# Patient Record
Sex: Male | Born: 1970 | Race: White | Hispanic: No | Marital: Married | Smoking: Never smoker
Health system: Southern US, Community
[De-identification: ages and names within clinical notes are randomized; demographics above are authoritative.]

## PROBLEM LIST (undated history)

## (undated) DIAGNOSIS — F419 Anxiety disorder, unspecified: Secondary | ICD-10-CM

## (undated) DIAGNOSIS — G4730 Sleep apnea, unspecified: Secondary | ICD-10-CM

## (undated) DIAGNOSIS — I10 Essential (primary) hypertension: Secondary | ICD-10-CM

## (undated) DIAGNOSIS — H00019 Hordeolum externum unspecified eye, unspecified eyelid: Secondary | ICD-10-CM

## (undated) HISTORY — PX: KNEE SURGERY: SHX244

## (undated) HISTORY — PX: NO PAST SURGERIES: SHX2092

---

## 2008-10-20 ENCOUNTER — Emergency Department: Payer: Self-pay | Admitting: Emergency Medicine

## 2009-05-18 ENCOUNTER — Emergency Department: Payer: Self-pay | Admitting: Emergency Medicine

## 2014-09-25 ENCOUNTER — Encounter: Payer: Self-pay | Admitting: Family Medicine

## 2014-09-25 ENCOUNTER — Ambulatory Visit (INDEPENDENT_AMBULATORY_CARE_PROVIDER_SITE_OTHER): Payer: BLUE CROSS/BLUE SHIELD | Admitting: Family Medicine

## 2014-09-25 VITALS — BP 122/78 | HR 77 | Resp 16 | Ht 71.0 in | Wt 194.4 lb

## 2014-09-25 DIAGNOSIS — B349 Viral infection, unspecified: Secondary | ICD-10-CM | POA: Diagnosis not present

## 2014-09-25 DIAGNOSIS — Z833 Family history of diabetes mellitus: Secondary | ICD-10-CM

## 2014-09-25 DIAGNOSIS — E663 Overweight: Secondary | ICD-10-CM | POA: Insufficient documentation

## 2014-09-25 DIAGNOSIS — Z283 Underimmunization status: Secondary | ICD-10-CM

## 2014-09-25 DIAGNOSIS — B351 Tinea unguium: Secondary | ICD-10-CM

## 2014-09-25 DIAGNOSIS — Z800 Family history of malignant neoplasm of digestive organs: Secondary | ICD-10-CM | POA: Diagnosis not present

## 2014-09-25 DIAGNOSIS — J029 Acute pharyngitis, unspecified: Secondary | ICD-10-CM | POA: Diagnosis not present

## 2014-09-25 DIAGNOSIS — Z289 Immunization not carried out for unspecified reason: Secondary | ICD-10-CM

## 2014-09-25 LAB — POCT RAPID STREP A (OFFICE): Rapid Strep A Screen: NEGATIVE

## 2014-09-25 NOTE — Progress Notes (Signed)
Date:  09/25/2014   Name:  Steven Barrett   DOB:  October 22, 1970   MRN:  [id number]  PCP:  Schuyler AmorWilliam Johnanna Bakke, MD    Chief Complaint: Cough and Fatigue   History of Present Illness:  This is a 44 y.o. male with ST for 2 days associated with NP cough, ear pressure, mild rhinorrhea, hoarse this morning, myalgias but no fever/chills. On Lamisil for 3 months for onychomycosis, working well. Tetanus unknown, no recent blood work.  Review of Systems:  Review of Systems  Patient Active Problem List   Diagnosis Date Noted  . Overweight (BMI 25.0-29.9) 09/25/2014  . FH: diabetes mellitus 09/25/2014  . FH: colon cancer 09/25/2014    Prior to Admission medications   Medication Sig Start Date End Date Taking? Authorizing Provider  terbinafine (LAMISIL) 250 MG tablet Take 1 tablet by mouth daily. 07/08/14 10/06/14 Yes Historical Provider, MD    No Known Allergies  Past Surgical History  Procedure Laterality Date  . No past surgeries      History  Substance Use Topics  . Smoking status: Never Smoker   . Smokeless tobacco: Not on file  . Alcohol Use: 0.0 oz/week    0 Standard drinks or equivalent per week     Comment: occasionally    Family History  Problem Relation Age of Onset  . Colon cancer Mother   . Diabetes Father   . Pancreatitis Father     Medication list has been reviewed and updated.  Physical Examination: BP 122/78 mmHg  Pulse 77  Resp 16  Ht 5\' 11"  (1.803 m)  Wt 194 lb 6.4 oz (88.179 kg)  BMI 27.13 kg/m2  SpO2 97%  Physical Exam  Constitutional: He is oriented to person, place, and time. He appears well-developed and well-nourished. No distress.  HENT:  Head: Normocephalic and atraumatic.  Right Ear: External ear normal.  Left Ear: External ear normal.  OP erythema no exudate  Eyes: Conjunctivae and EOM are normal. Pupils are equal, round, and reactive to light. No scleral icterus.  Neck: Neck supple. No thyromegaly present.  Cardiovascular: Normal rate,  regular rhythm and normal heart sounds.   Pulmonary/Chest: Effort normal and breath sounds normal.  Abdominal: Soft. He exhibits no distension and no mass. There is no tenderness.  Musculoskeletal: He exhibits no edema.  Lymphadenopathy:    He has no cervical adenopathy.  Neurological: He is alert and oriented to person, place, and time. Coordination normal.  Skin: Skin is warm and dry. No rash noted.  Psychiatric: He has a normal mood and affect. His behavior is normal.    Assessment and Plan:  1. Overweight (BMI 25.0-29.9) Discussed exercise/weight loss - COMPLETE METABOLIC PANEL WITH GFR - CBC - Lipid Profile  2. FH: diabetes mellitus - HgB A1c  3. Pharyngitis with viral syndrome RST negative, likely viral - POCT rapid strep A  4. Onychomycosis Complete course Lamisil  5. FH: colon cancer Advised colonoscopy age 44  6. Delayed immunizations Needs Tdap, not available today   Return in about 4 weeks (around 10/23/2014).  Steven AnoWilliam M. Kingsley Barrett, Jr. MD [facility]  09/25/2014

## 2014-09-26 LAB — LIPID PANEL
CHOL/HDL RATIO: 4 ratio (ref 0.0–5.0)
Cholesterol, Total: 132 mg/dL (ref 100–199)
HDL: 33 mg/dL — ABNORMAL LOW (ref >39)
LDL Calculated: 62 mg/dL (ref 0–99)
TRIGLYCERIDES: 183 mg/dL — AB (ref 0–149)
VLDL Cholesterol Cal: 37 mg/dL (ref 5–40)

## 2014-09-26 LAB — CBC
HEMATOCRIT: 45.9 % (ref 37.5–51.0)
Hemoglobin: 16 g/dL (ref 12.6–17.7)
MCH: 31.4 pg (ref 26.6–33.0)
MCHC: 34.9 g/dL (ref 31.5–35.7)
MCV: 90 fL (ref 79–97)
Platelets: 214 10*3/uL (ref 150–379)
RBC: 5.09 x10E6/uL (ref 4.14–5.80)
RDW: 13.7 % (ref 12.3–15.4)
WBC: 8.5 10*3/uL (ref 3.4–10.8)

## 2014-09-26 LAB — HEMOGLOBIN A1C
ESTIMATED AVERAGE GLUCOSE: 114 mg/dL
Hgb A1c MFr Bld: 5.6 % (ref 4.8–5.6)

## 2014-09-26 LAB — CREATINE: Creatine, Serum: 0.2 mg/dL (ref 0.0–0.7)

## 2014-10-02 ENCOUNTER — Encounter: Payer: Self-pay | Admitting: Family Medicine

## 2016-03-08 DIAGNOSIS — M530 Cervicocranial syndrome: Secondary | ICD-10-CM | POA: Diagnosis not present

## 2016-03-08 DIAGNOSIS — M9901 Segmental and somatic dysfunction of cervical region: Secondary | ICD-10-CM | POA: Diagnosis not present

## 2016-03-08 DIAGNOSIS — M531 Cervicobrachial syndrome: Secondary | ICD-10-CM | POA: Diagnosis not present

## 2016-03-08 DIAGNOSIS — M9902 Segmental and somatic dysfunction of thoracic region: Secondary | ICD-10-CM | POA: Diagnosis not present

## 2016-04-06 DIAGNOSIS — M531 Cervicobrachial syndrome: Secondary | ICD-10-CM | POA: Diagnosis not present

## 2016-04-06 DIAGNOSIS — M9901 Segmental and somatic dysfunction of cervical region: Secondary | ICD-10-CM | POA: Diagnosis not present

## 2016-04-06 DIAGNOSIS — M9902 Segmental and somatic dysfunction of thoracic region: Secondary | ICD-10-CM | POA: Diagnosis not present

## 2016-04-06 DIAGNOSIS — M530 Cervicocranial syndrome: Secondary | ICD-10-CM | POA: Diagnosis not present

## 2016-04-27 DIAGNOSIS — M530 Cervicocranial syndrome: Secondary | ICD-10-CM | POA: Diagnosis not present

## 2016-04-27 DIAGNOSIS — M9902 Segmental and somatic dysfunction of thoracic region: Secondary | ICD-10-CM | POA: Diagnosis not present

## 2016-04-27 DIAGNOSIS — M9901 Segmental and somatic dysfunction of cervical region: Secondary | ICD-10-CM | POA: Diagnosis not present

## 2016-04-27 DIAGNOSIS — M531 Cervicobrachial syndrome: Secondary | ICD-10-CM | POA: Diagnosis not present

## 2016-05-17 DIAGNOSIS — Z3009 Encounter for other general counseling and advice on contraception: Secondary | ICD-10-CM | POA: Diagnosis not present

## 2016-05-19 DIAGNOSIS — M530 Cervicocranial syndrome: Secondary | ICD-10-CM | POA: Diagnosis not present

## 2016-05-19 DIAGNOSIS — M9902 Segmental and somatic dysfunction of thoracic region: Secondary | ICD-10-CM | POA: Diagnosis not present

## 2016-05-19 DIAGNOSIS — M9901 Segmental and somatic dysfunction of cervical region: Secondary | ICD-10-CM | POA: Diagnosis not present

## 2016-05-19 DIAGNOSIS — M531 Cervicobrachial syndrome: Secondary | ICD-10-CM | POA: Diagnosis not present

## 2016-06-09 DIAGNOSIS — M9901 Segmental and somatic dysfunction of cervical region: Secondary | ICD-10-CM | POA: Diagnosis not present

## 2016-06-09 DIAGNOSIS — M530 Cervicocranial syndrome: Secondary | ICD-10-CM | POA: Diagnosis not present

## 2016-06-09 DIAGNOSIS — M9902 Segmental and somatic dysfunction of thoracic region: Secondary | ICD-10-CM | POA: Diagnosis not present

## 2016-06-09 DIAGNOSIS — M531 Cervicobrachial syndrome: Secondary | ICD-10-CM | POA: Diagnosis not present

## 2016-06-16 DIAGNOSIS — M9901 Segmental and somatic dysfunction of cervical region: Secondary | ICD-10-CM | POA: Diagnosis not present

## 2016-06-16 DIAGNOSIS — M530 Cervicocranial syndrome: Secondary | ICD-10-CM | POA: Diagnosis not present

## 2016-06-16 DIAGNOSIS — M9902 Segmental and somatic dysfunction of thoracic region: Secondary | ICD-10-CM | POA: Diagnosis not present

## 2016-06-16 DIAGNOSIS — M531 Cervicobrachial syndrome: Secondary | ICD-10-CM | POA: Diagnosis not present

## 2016-08-11 DIAGNOSIS — M530 Cervicocranial syndrome: Secondary | ICD-10-CM | POA: Diagnosis not present

## 2016-08-11 DIAGNOSIS — M9902 Segmental and somatic dysfunction of thoracic region: Secondary | ICD-10-CM | POA: Diagnosis not present

## 2016-08-11 DIAGNOSIS — M9901 Segmental and somatic dysfunction of cervical region: Secondary | ICD-10-CM | POA: Diagnosis not present

## 2016-08-11 DIAGNOSIS — M531 Cervicobrachial syndrome: Secondary | ICD-10-CM | POA: Diagnosis not present

## 2016-09-07 DIAGNOSIS — M530 Cervicocranial syndrome: Secondary | ICD-10-CM | POA: Diagnosis not present

## 2016-09-07 DIAGNOSIS — M9901 Segmental and somatic dysfunction of cervical region: Secondary | ICD-10-CM | POA: Diagnosis not present

## 2016-09-07 DIAGNOSIS — M9902 Segmental and somatic dysfunction of thoracic region: Secondary | ICD-10-CM | POA: Diagnosis not present

## 2016-09-07 DIAGNOSIS — M531 Cervicobrachial syndrome: Secondary | ICD-10-CM | POA: Diagnosis not present

## 2016-09-27 DIAGNOSIS — M9902 Segmental and somatic dysfunction of thoracic region: Secondary | ICD-10-CM | POA: Diagnosis not present

## 2016-09-27 DIAGNOSIS — M530 Cervicocranial syndrome: Secondary | ICD-10-CM | POA: Diagnosis not present

## 2016-09-27 DIAGNOSIS — M531 Cervicobrachial syndrome: Secondary | ICD-10-CM | POA: Diagnosis not present

## 2016-09-27 DIAGNOSIS — M9901 Segmental and somatic dysfunction of cervical region: Secondary | ICD-10-CM | POA: Diagnosis not present

## 2016-10-18 DIAGNOSIS — M9902 Segmental and somatic dysfunction of thoracic region: Secondary | ICD-10-CM | POA: Diagnosis not present

## 2016-10-18 DIAGNOSIS — M530 Cervicocranial syndrome: Secondary | ICD-10-CM | POA: Diagnosis not present

## 2016-10-18 DIAGNOSIS — M531 Cervicobrachial syndrome: Secondary | ICD-10-CM | POA: Diagnosis not present

## 2016-10-18 DIAGNOSIS — M9901 Segmental and somatic dysfunction of cervical region: Secondary | ICD-10-CM | POA: Diagnosis not present

## 2016-11-08 DIAGNOSIS — M9902 Segmental and somatic dysfunction of thoracic region: Secondary | ICD-10-CM | POA: Diagnosis not present

## 2016-11-08 DIAGNOSIS — M9901 Segmental and somatic dysfunction of cervical region: Secondary | ICD-10-CM | POA: Diagnosis not present

## 2016-11-08 DIAGNOSIS — M530 Cervicocranial syndrome: Secondary | ICD-10-CM | POA: Diagnosis not present

## 2016-11-08 DIAGNOSIS — M531 Cervicobrachial syndrome: Secondary | ICD-10-CM | POA: Diagnosis not present

## 2016-12-06 DIAGNOSIS — M531 Cervicobrachial syndrome: Secondary | ICD-10-CM | POA: Diagnosis not present

## 2016-12-06 DIAGNOSIS — M9902 Segmental and somatic dysfunction of thoracic region: Secondary | ICD-10-CM | POA: Diagnosis not present

## 2016-12-06 DIAGNOSIS — M9901 Segmental and somatic dysfunction of cervical region: Secondary | ICD-10-CM | POA: Diagnosis not present

## 2016-12-06 DIAGNOSIS — M530 Cervicocranial syndrome: Secondary | ICD-10-CM | POA: Diagnosis not present

## 2016-12-27 DIAGNOSIS — M9901 Segmental and somatic dysfunction of cervical region: Secondary | ICD-10-CM | POA: Diagnosis not present

## 2016-12-27 DIAGNOSIS — M530 Cervicocranial syndrome: Secondary | ICD-10-CM | POA: Diagnosis not present

## 2016-12-27 DIAGNOSIS — M531 Cervicobrachial syndrome: Secondary | ICD-10-CM | POA: Diagnosis not present

## 2016-12-27 DIAGNOSIS — M9902 Segmental and somatic dysfunction of thoracic region: Secondary | ICD-10-CM | POA: Diagnosis not present

## 2017-01-18 DIAGNOSIS — M531 Cervicobrachial syndrome: Secondary | ICD-10-CM | POA: Diagnosis not present

## 2017-01-18 DIAGNOSIS — M530 Cervicocranial syndrome: Secondary | ICD-10-CM | POA: Diagnosis not present

## 2017-01-18 DIAGNOSIS — M9901 Segmental and somatic dysfunction of cervical region: Secondary | ICD-10-CM | POA: Diagnosis not present

## 2017-01-18 DIAGNOSIS — M9902 Segmental and somatic dysfunction of thoracic region: Secondary | ICD-10-CM | POA: Diagnosis not present

## 2017-02-07 DIAGNOSIS — M9901 Segmental and somatic dysfunction of cervical region: Secondary | ICD-10-CM | POA: Diagnosis not present

## 2017-02-07 DIAGNOSIS — M9902 Segmental and somatic dysfunction of thoracic region: Secondary | ICD-10-CM | POA: Diagnosis not present

## 2017-02-07 DIAGNOSIS — M531 Cervicobrachial syndrome: Secondary | ICD-10-CM | POA: Diagnosis not present

## 2017-02-07 DIAGNOSIS — M530 Cervicocranial syndrome: Secondary | ICD-10-CM | POA: Diagnosis not present

## 2017-03-08 DIAGNOSIS — M9901 Segmental and somatic dysfunction of cervical region: Secondary | ICD-10-CM | POA: Diagnosis not present

## 2017-03-08 DIAGNOSIS — M530 Cervicocranial syndrome: Secondary | ICD-10-CM | POA: Diagnosis not present

## 2017-03-08 DIAGNOSIS — M531 Cervicobrachial syndrome: Secondary | ICD-10-CM | POA: Diagnosis not present

## 2017-03-08 DIAGNOSIS — M9902 Segmental and somatic dysfunction of thoracic region: Secondary | ICD-10-CM | POA: Diagnosis not present

## 2017-04-04 DIAGNOSIS — M531 Cervicobrachial syndrome: Secondary | ICD-10-CM | POA: Diagnosis not present

## 2017-04-04 DIAGNOSIS — M9901 Segmental and somatic dysfunction of cervical region: Secondary | ICD-10-CM | POA: Diagnosis not present

## 2017-04-04 DIAGNOSIS — M530 Cervicocranial syndrome: Secondary | ICD-10-CM | POA: Diagnosis not present

## 2017-04-04 DIAGNOSIS — M9902 Segmental and somatic dysfunction of thoracic region: Secondary | ICD-10-CM | POA: Diagnosis not present

## 2017-04-30 DIAGNOSIS — J00 Acute nasopharyngitis [common cold]: Secondary | ICD-10-CM | POA: Diagnosis not present

## 2017-04-30 DIAGNOSIS — R51 Headache: Secondary | ICD-10-CM | POA: Diagnosis not present

## 2017-04-30 DIAGNOSIS — M79605 Pain in left leg: Secondary | ICD-10-CM | POA: Diagnosis not present

## 2017-04-30 DIAGNOSIS — R197 Diarrhea, unspecified: Secondary | ICD-10-CM | POA: Diagnosis not present

## 2017-04-30 DIAGNOSIS — M79604 Pain in right leg: Secondary | ICD-10-CM | POA: Diagnosis not present

## 2017-04-30 DIAGNOSIS — R82998 Other abnormal findings in urine: Secondary | ICD-10-CM | POA: Diagnosis not present

## 2017-04-30 DIAGNOSIS — R112 Nausea with vomiting, unspecified: Secondary | ICD-10-CM | POA: Diagnosis not present

## 2017-05-02 DIAGNOSIS — M531 Cervicobrachial syndrome: Secondary | ICD-10-CM | POA: Diagnosis not present

## 2017-05-02 DIAGNOSIS — M9902 Segmental and somatic dysfunction of thoracic region: Secondary | ICD-10-CM | POA: Diagnosis not present

## 2017-05-02 DIAGNOSIS — M9901 Segmental and somatic dysfunction of cervical region: Secondary | ICD-10-CM | POA: Diagnosis not present

## 2017-05-02 DIAGNOSIS — M530 Cervicocranial syndrome: Secondary | ICD-10-CM | POA: Diagnosis not present

## 2017-05-30 DIAGNOSIS — M531 Cervicobrachial syndrome: Secondary | ICD-10-CM | POA: Diagnosis not present

## 2017-05-30 DIAGNOSIS — M9901 Segmental and somatic dysfunction of cervical region: Secondary | ICD-10-CM | POA: Diagnosis not present

## 2017-05-30 DIAGNOSIS — M9902 Segmental and somatic dysfunction of thoracic region: Secondary | ICD-10-CM | POA: Diagnosis not present

## 2017-05-30 DIAGNOSIS — M530 Cervicocranial syndrome: Secondary | ICD-10-CM | POA: Diagnosis not present

## 2017-06-08 DIAGNOSIS — M531 Cervicobrachial syndrome: Secondary | ICD-10-CM | POA: Diagnosis not present

## 2017-06-08 DIAGNOSIS — M9902 Segmental and somatic dysfunction of thoracic region: Secondary | ICD-10-CM | POA: Diagnosis not present

## 2017-06-08 DIAGNOSIS — M9901 Segmental and somatic dysfunction of cervical region: Secondary | ICD-10-CM | POA: Diagnosis not present

## 2017-06-08 DIAGNOSIS — M530 Cervicocranial syndrome: Secondary | ICD-10-CM | POA: Diagnosis not present

## 2017-06-20 DIAGNOSIS — M9901 Segmental and somatic dysfunction of cervical region: Secondary | ICD-10-CM | POA: Diagnosis not present

## 2017-06-20 DIAGNOSIS — M9902 Segmental and somatic dysfunction of thoracic region: Secondary | ICD-10-CM | POA: Diagnosis not present

## 2017-06-20 DIAGNOSIS — M531 Cervicobrachial syndrome: Secondary | ICD-10-CM | POA: Diagnosis not present

## 2017-06-20 DIAGNOSIS — M530 Cervicocranial syndrome: Secondary | ICD-10-CM | POA: Diagnosis not present

## 2017-07-26 DIAGNOSIS — M9902 Segmental and somatic dysfunction of thoracic region: Secondary | ICD-10-CM | POA: Diagnosis not present

## 2017-07-26 DIAGNOSIS — M531 Cervicobrachial syndrome: Secondary | ICD-10-CM | POA: Diagnosis not present

## 2017-07-26 DIAGNOSIS — M530 Cervicocranial syndrome: Secondary | ICD-10-CM | POA: Diagnosis not present

## 2017-07-26 DIAGNOSIS — M9901 Segmental and somatic dysfunction of cervical region: Secondary | ICD-10-CM | POA: Diagnosis not present

## 2017-08-24 DIAGNOSIS — M9901 Segmental and somatic dysfunction of cervical region: Secondary | ICD-10-CM | POA: Diagnosis not present

## 2017-08-24 DIAGNOSIS — M9902 Segmental and somatic dysfunction of thoracic region: Secondary | ICD-10-CM | POA: Diagnosis not present

## 2017-08-24 DIAGNOSIS — M531 Cervicobrachial syndrome: Secondary | ICD-10-CM | POA: Diagnosis not present

## 2017-08-24 DIAGNOSIS — M530 Cervicocranial syndrome: Secondary | ICD-10-CM | POA: Diagnosis not present

## 2017-10-18 DIAGNOSIS — M531 Cervicobrachial syndrome: Secondary | ICD-10-CM | POA: Diagnosis not present

## 2017-10-18 DIAGNOSIS — M9901 Segmental and somatic dysfunction of cervical region: Secondary | ICD-10-CM | POA: Diagnosis not present

## 2017-10-18 DIAGNOSIS — M530 Cervicocranial syndrome: Secondary | ICD-10-CM | POA: Diagnosis not present

## 2017-10-18 DIAGNOSIS — M9902 Segmental and somatic dysfunction of thoracic region: Secondary | ICD-10-CM | POA: Diagnosis not present

## 2017-11-09 DIAGNOSIS — M9901 Segmental and somatic dysfunction of cervical region: Secondary | ICD-10-CM | POA: Diagnosis not present

## 2017-11-09 DIAGNOSIS — M9902 Segmental and somatic dysfunction of thoracic region: Secondary | ICD-10-CM | POA: Diagnosis not present

## 2017-11-09 DIAGNOSIS — M531 Cervicobrachial syndrome: Secondary | ICD-10-CM | POA: Diagnosis not present

## 2017-11-09 DIAGNOSIS — M530 Cervicocranial syndrome: Secondary | ICD-10-CM | POA: Diagnosis not present

## 2017-11-15 DIAGNOSIS — H00024 Hordeolum internum left upper eyelid: Secondary | ICD-10-CM | POA: Diagnosis not present

## 2018-01-02 DIAGNOSIS — M531 Cervicobrachial syndrome: Secondary | ICD-10-CM | POA: Diagnosis not present

## 2018-01-02 DIAGNOSIS — M530 Cervicocranial syndrome: Secondary | ICD-10-CM | POA: Diagnosis not present

## 2018-01-02 DIAGNOSIS — M9902 Segmental and somatic dysfunction of thoracic region: Secondary | ICD-10-CM | POA: Diagnosis not present

## 2018-01-02 DIAGNOSIS — M9901 Segmental and somatic dysfunction of cervical region: Secondary | ICD-10-CM | POA: Diagnosis not present

## 2018-01-09 DIAGNOSIS — M530 Cervicocranial syndrome: Secondary | ICD-10-CM | POA: Diagnosis not present

## 2018-01-09 DIAGNOSIS — M9902 Segmental and somatic dysfunction of thoracic region: Secondary | ICD-10-CM | POA: Diagnosis not present

## 2018-01-09 DIAGNOSIS — M531 Cervicobrachial syndrome: Secondary | ICD-10-CM | POA: Diagnosis not present

## 2018-01-09 DIAGNOSIS — M9901 Segmental and somatic dysfunction of cervical region: Secondary | ICD-10-CM | POA: Diagnosis not present

## 2018-01-10 DIAGNOSIS — H00014 Hordeolum externum left upper eyelid: Secondary | ICD-10-CM | POA: Diagnosis not present

## 2018-01-13 ENCOUNTER — Other Ambulatory Visit: Payer: Self-pay

## 2018-01-13 ENCOUNTER — Encounter: Payer: Self-pay | Admitting: Emergency Medicine

## 2018-01-13 ENCOUNTER — Ambulatory Visit
Admission: EM | Admit: 2018-01-13 | Discharge: 2018-01-13 | Disposition: A | Discharge status: Home / Self Care | Payer: BLUE CROSS/BLUE SHIELD | Attending: Family Medicine | Admitting: Family Medicine

## 2018-01-13 DIAGNOSIS — L0100 Impetigo, unspecified: Secondary | ICD-10-CM | POA: Diagnosis not present

## 2018-01-13 MED ORDER — MUPIROCIN 2 % EX OINT
1.0000 | TOPICAL_OINTMENT | Freq: Three times a day (TID) | CUTANEOUS | 0 refills | Status: DC
Start: 2018-01-13 — End: 2018-01-19

## 2018-01-13 MED ORDER — DOXYCYCLINE HYCLATE 100 MG PO CAPS: 100.0000 mg | ORAL_CAPSULE | Freq: Two times a day (BID) | ORAL | 0 refills | Status: DC

## 2018-01-13 NOTE — ED Triage Notes (Signed)
Patient c/o red burning rash on his chest and an itchy rash on his right shoulder for the past 2 weeks.

## 2018-01-13 NOTE — ED Provider Notes (Signed)
[facility]    CSN: [id number] Arrival date & time: 01/13/18  1515     History   Chief Complaint Chief Complaint  Patient presents with  . Rash    HPI Nhan Qualley is a 47 y.o. male.   HPI  47 year old male presents with a red burning itchy  rash on his chest, under his chin, his neck and right shoulder.  He states he has satellite lesions in the groin as well as on his buttock. His son was diagnosed with impetigo about 2 weeks ago.  They had been wrestling playfully recently.  His other child also had impetigo prior to his son.         History reviewed. No pertinent past medical history.  Patient Active Problem List   Diagnosis Date Noted  . Overweight (BMI 25.0-29.9) 09/25/2014  . FH: diabetes mellitus 09/25/2014  . FH: colon cancer 09/25/2014    Past Surgical History:  Procedure Laterality Date  . KNEE SURGERY Right   . NO PAST SURGERIES         Home Medications    Prior to Admission medications   Medication Sig Start Date End Date Taking? Authorizing Provider  doxycycline (VIBRAMYCIN) 100 MG capsule Take 1 capsule (100 mg total) by mouth 2 (two) times daily. 01/13/18   Lutricia Feil, [name]  mupirocin ointment (BACTROBAN) 2 % Apply 1 application topically 3 (three) times daily. 01/13/18   Lutricia Feil, [name]    Family History Family History  Problem Relation Age of Onset  . Colon cancer Mother   . Diabetes Father   . Pancreatitis Father     Social History Social History   Tobacco Use  . Smoking status: Never Smoker  . Smokeless tobacco: Current User    Types: Chew  Substance Use Topics  . Alcohol use: Yes    Alcohol/week: 0.0 standard drinks    Comment: occasionally  . Drug use: No     Allergies   Patient has no known allergies.   Review of Systems Review of Systems  Constitutional: Negative for activity change, appetite change, chills, fatigue and fever.  Skin: Positive for rash.  All other systems  reviewed and are negative.        Physical Exam Triage Vital Signs ED Triage Vitals  Enc Vitals Group     BP 01/13/18 1528 (!) 141/91     Pulse Rate 01/13/18 1528 63     Resp 01/13/18 1528 16     Temp 01/13/18 1528 98 F (36.7 C)     Temp Source 01/13/18 1528 Oral     SpO2 01/13/18 1528 98 %     Weight 01/13/18 1524 205 lb (93 kg)     Height 01/13/18 1524 5\' 11"  (1.803 m)     Head Circumference --      Peak Flow --      Pain Score 01/13/18 1522 4     Pain Loc --      Pain Edu? --      Excl. in GC? --    No data found.  Updated Vital Signs BP (!) 141/91 (BP Location: Right Arm)   Pulse 63   Temp 98 F (36.7 C) (Oral)   Resp 16   Ht 5\' 11"  (1.803 m)   Wt 205 lb (93 kg)   SpO2 98%   BMI 28.59 kg/m   Visual Acuity Right Eye Distance:   Left Eye Distance:   Bilateral Distance:  Right Eye Near:   Left Eye Near:    Bilateral Near:     Physical Exam  Constitutional: He is oriented to person, place, and time. He appears well-developed and well-nourished. No distress.  HENT:  Head: Normocephalic.  Eyes: Pupils are equal, round, and reactive to light.  Neck: Normal range of motion.  Musculoskeletal: Normal range of motion.  Neurological: He is alert and oriented to person, place, and time.  Skin: Skin is warm and dry. Rash noted. He is not diaphoretic.  Had a several erythematous base is with a scaling and honey crusting over top.  Some excoriations present.  Refer to attached photographs for detail.  Psychiatric: He has a normal mood and affect. His behavior is normal. Thought content normal.  Nursing note and vitals reviewed.    UC Treatments / Results  Labs (all labs ordered are listed, but only abnormal results are displayed) Labs Reviewed - No data to display  EKG None  Radiology No results found.  Procedures Procedures (including critical care time)  Medications Ordered in UC Medications - No data to display  Initial Impression /  Assessment and Plan / UC Course  I have reviewed the triage vital signs and the nursing notes.  Pertinent labs & imaging results that were available during my care of the patient were reviewed by me and considered in my medical decision making (see chart for details).   I will treat the patient with both topical mupirocin ointment to apply 3 times daily and also start him on doxycycline 1 twice daily for 7 days because of the extent of the other lesions.  Provided him with information regarding impetigo and how in infectious this is.  He is not improving he should follow-up with dermatology.  . Final Clinical Impressions(s) / UC Diagnoses   Final diagnoses:  Impetigo   Discharge Instructions   None    ED Prescriptions    Medication Sig Dispense Auth. Provider   mupirocin ointment (BACTROBAN) 2 % Apply 1 application topically 3 (three) times daily. 30 g Ovid Curd P, [name]   doxycycline (VIBRAMYCIN) 100 MG capsule Take 1 capsule (100 mg total) by mouth 2 (two) times daily. 14 capsule Lutricia Feil, [name]     Controlled Substance Prescriptions [place] Controlled Substance Registry consulted? Not Applicable   Lutricia Feil, [name] 01/13/18 1606

## 2018-01-15 DIAGNOSIS — R42 Dizziness and giddiness: Secondary | ICD-10-CM | POA: Diagnosis not present

## 2018-01-15 DIAGNOSIS — L0100 Impetigo, unspecified: Secondary | ICD-10-CM | POA: Diagnosis not present

## 2018-01-15 DIAGNOSIS — R0602 Shortness of breath: Secondary | ICD-10-CM | POA: Diagnosis not present

## 2018-01-15 DIAGNOSIS — R0789 Other chest pain: Secondary | ICD-10-CM | POA: Diagnosis not present

## 2018-01-15 DIAGNOSIS — Z6828 Body mass index (BMI) 28.0-28.9, adult: Secondary | ICD-10-CM | POA: Diagnosis not present

## 2018-01-15 DIAGNOSIS — R110 Nausea: Secondary | ICD-10-CM | POA: Diagnosis not present

## 2018-01-15 DIAGNOSIS — I10 Essential (primary) hypertension: Secondary | ICD-10-CM | POA: Diagnosis not present

## 2018-01-15 DIAGNOSIS — R079 Chest pain, unspecified: Secondary | ICD-10-CM | POA: Diagnosis not present

## 2018-01-16 DIAGNOSIS — R42 Dizziness and giddiness: Secondary | ICD-10-CM | POA: Diagnosis not present

## 2018-01-16 DIAGNOSIS — R079 Chest pain, unspecified: Secondary | ICD-10-CM | POA: Diagnosis not present

## 2018-01-19 ENCOUNTER — Telehealth: Payer: Self-pay

## 2018-01-19 MED ORDER — MUPIROCIN 2 % EX OINT: 1.0000 "application " | TOPICAL_OINTMENT | Freq: Three times a day (TID) | CUTANEOUS | 1 refills | Status: DC

## 2018-01-20 ENCOUNTER — Ambulatory Visit (INDEPENDENT_AMBULATORY_CARE_PROVIDER_SITE_OTHER): Payer: BLUE CROSS/BLUE SHIELD | Admitting: Physician Assistant

## 2018-01-20 ENCOUNTER — Encounter: Payer: Self-pay | Admitting: Physician Assistant

## 2018-01-20 VITALS — BP 160/100 | HR 69 | Temp 98.1°F | Resp 16 | Wt 205.6 lb

## 2018-01-20 DIAGNOSIS — Z800 Family history of malignant neoplasm of digestive organs: Secondary | ICD-10-CM | POA: Diagnosis not present

## 2018-01-20 DIAGNOSIS — I10 Essential (primary) hypertension: Secondary | ICD-10-CM | POA: Diagnosis not present

## 2018-01-20 DIAGNOSIS — R739 Hyperglycemia, unspecified: Secondary | ICD-10-CM

## 2018-01-20 MED ORDER — AMLODIPINE BESYLATE 5 MG PO TABS: 5.0000 mg | ORAL_TABLET | Freq: Every day | ORAL | 0 refills | Status: DC

## 2018-01-20 NOTE — Progress Notes (Signed)
Patient: Steven Barrett, Male    DOB: November 07, 1970, 47 y.o.   MRN: [id number] Visit Date: 01/25/2018  Today's Provider: Trey Sailors, [name]   Chief Complaint  Patient presents with  . Establish Care  . New Patient (Initial Visit)   Subjective:    Establish Care Steven Barrett is a 47 y.o. male who presents today to establish care and for high blood pressure. Patient was also seen at [facility] on 10/27 for lightheadedness and chest pain and on 10/25 for Impetigo.Living in [place]. Wife and child age 27. Driver for [employer].  Patient reports he has checked his blood pressure at [facility] and it was 154/98.  Mother is age 61 when she was diagnosed with colon cancer   He reports having some episodes of lightheadedness. He also reports having an "off" feeling while driving, like he has to blink multiple times to get back to normal.   BP Readings from Last 3 Encounters:  01/24/18 (!) 132/98  01/20/18 (!) 160/100  01/13/18 (!) 141/91   Wt Readings from Last 3 Encounters:  01/24/18 208 lb (94.3 kg)  01/20/18 205 lb 9.6 oz (93.3 kg)  01/13/18 205 lb (93 kg)   -----------------------------------------------------------------   Review of Systems  Constitutional: Positive for fatigue.  Respiratory: Positive for chest tightness.   Musculoskeletal: Positive for back pain.  Skin: Positive for rash.  Neurological: Positive for dizziness.  Psychiatric/Behavioral: The patient is nervous/anxious.   All other systems reviewed and are negative.   Social History      He  reports that he has never smoked. His smokeless tobacco use includes chew. He reports that he drinks alcohol. He reports that he does not use drugs.       Social History   Socioeconomic History  . Marital status: Married    Spouse name: Not on file  . Number of children: Not on file  . Years of education: 12th grade  . Highest education level: Not on file  Occupational History  . Not on file  Social  Needs  . Financial resource strain: Not on file  . Food insecurity:    Worry: Not on file    Inability: Not on file  . Transportation needs:    Medical: Not on file    Non-medical: Not on file  Tobacco Use  . Smoking status: Never Smoker  . Smokeless tobacco: Current User    Types: Chew  Substance and Sexual Activity  . Alcohol use: Yes    Alcohol/week: 0.0 standard drinks    Comment: occasionally  . Drug use: No  . Sexual activity: Not on file  Lifestyle  . Physical activity:    Days per week: Not on file    Minutes per session: Not on file  . Stress: Not on file  Relationships  . Social connections:    Talks on phone: Not on file    Gets together: Not on file    Attends religious service: Not on file    Active member of club or organization: Not on file    Attends meetings of clubs or organizations: Not on file    Relationship status: Not on file  Other Topics Concern  . Not on file  Social History Narrative  . Not on file    History reviewed. No pertinent past medical history.   Patient Active Problem List   Diagnosis Date Noted  . Hypertension 01/20/2018  . Overweight (BMI 25.0-29.9) 09/25/2014  .  FH: diabetes mellitus 09/25/2014  . FH: colon cancer 09/25/2014    Past Surgical History:  Procedure Laterality Date  . KNEE SURGERY Right   . NO PAST SURGERIES      Family History        Family Status  Relation Name Status  . Mother  Alive  . Father  Alive        His family history includes Colon cancer in his mother; Diabetes in his father; Pancreatitis in his father.      No Known Allergies   Current Outpatient Medications:  .  amLODipine (NORVASC) 10 MG tablet, Take 1 tablet (10 mg total) by mouth daily., Disp: 90 tablet, Rfl: 3 .  mupirocin ointment (BACTROBAN) 2 %, Apply 1 application topically 3 (three) times daily., Disp: 30 g, Rfl: 1   Patient Care Team: Maryella Shivers as PCP - General (Physician Assistant)      Objective:    Vitals: BP (!) 160/100 (BP Location: Right Arm, Patient Position: Sitting, Cuff Size: Large)   Pulse 69   Temp 98.1 F (36.7 C) (Oral)   Resp 16   Wt 205 lb 9.6 oz (93.3 kg)   SpO2 97%   BMI 28.68 kg/m    Vitals:   01/20/18 1027  BP: (!) 160/100  Pulse: 69  Resp: 16  Temp: 98.1 F (36.7 C)  TempSrc: Oral  SpO2: 97%  Weight: 205 lb 9.6 oz (93.3 kg)     Physical Exam  Constitutional: He is oriented to person, place, and time.  Cardiovascular: Normal rate and regular rhythm.  Pulmonary/Chest: Effort normal and breath sounds normal.  Neurological: He is alert and oriented to person, place, and time.  Skin: Skin is warm and dry.  Psychiatric: He has a normal mood and affect. His behavior is normal.     Depression Screen PHQ 2/9 Scores 01/20/2018  PHQ - 2 Score 1      Assessment & Plan:     Routine Health Maintenance and Physical Exam  Exercise Activities and Dietary recommendations Goals   None      There is no immunization history on file for this patient.  Health Maintenance  Topic Date Due  . HIV Screening  07/10/1986  . TETANUS/TDAP  07/10/1990  . INFLUENZA VACCINE  10/20/2017     Discussed health benefits of physical activity, and encouraged him to engage in regular exercise appropriate for his age and condition.    1. Hypertension, unspecified type  Start amlodipine 5 mg daily. I have reviewed CBC and CMET from [facility] ER visit and they are normal. Work note provided.  - Lipid Profile - TSH  2. Hyperglycemia  - HgB A1c  3. FH: colon cancer  Will need colonoscopy.  Return in about 1 week (around 01/27/2018).  The entirety of the information documented in the History of Present Illness, Review of Systems and Physical Exam were personally obtained by me. Portions of this information were initially documented by Horton Marshall, CMA and reviewed by me for thoroughness and accuracy.     --------------------------------------------------------------------    Trey Sailors, [name]  [facility]

## 2018-01-21 LAB — HEMOGLOBIN A1C
Est. average glucose Bld gHb Est-mCnc: 105 mg/dL
Hgb A1c MFr Bld: 5.3 % (ref 4.8–5.6)

## 2018-01-21 LAB — LIPID PANEL
Chol/HDL Ratio: 4.2 ratio (ref 0.0–5.0)
Cholesterol, Total: 122 mg/dL (ref 100–199)
HDL: 29 mg/dL — ABNORMAL LOW (ref >39)
LDL Calculated: 46 mg/dL (ref 0–99)
Triglycerides: 236 mg/dL — ABNORMAL HIGH (ref 0–149)
VLDL Cholesterol Cal: 47 mg/dL — ABNORMAL HIGH (ref 5–40)

## 2018-01-21 LAB — TSH: TSH: 2.07 u[IU]/mL (ref 0.450–4.500)

## 2018-01-23 ENCOUNTER — Ambulatory Visit: Payer: BLUE CROSS/BLUE SHIELD | Admitting: Family Medicine

## 2018-01-23 DIAGNOSIS — R079 Chest pain, unspecified: Secondary | ICD-10-CM | POA: Diagnosis not present

## 2018-01-24 ENCOUNTER — Ambulatory Visit: Payer: BLUE CROSS/BLUE SHIELD | Admitting: Physician Assistant

## 2018-01-24 VITALS — BP 132/98 | HR 76 | Temp 97.9°F | Resp 16 | Wt 208.0 lb

## 2018-01-24 DIAGNOSIS — R42 Dizziness and giddiness: Secondary | ICD-10-CM | POA: Diagnosis not present

## 2018-01-24 DIAGNOSIS — R29818 Other symptoms and signs involving the nervous system: Secondary | ICD-10-CM

## 2018-01-24 DIAGNOSIS — I10 Essential (primary) hypertension: Secondary | ICD-10-CM | POA: Diagnosis not present

## 2018-01-24 MED ORDER — AMLODIPINE BESYLATE 10 MG PO TABS: 10.0000 mg | ORAL_TABLET | Freq: Every day | ORAL | 3 refills | Status: DC

## 2018-01-24 MED ORDER — AMLODIPINE BESYLATE 10 MG PO TABS: 10.0000 mg | ORAL_TABLET | Freq: Every day | ORAL | 0 refills | Status: DC

## 2018-01-24 NOTE — Patient Instructions (Signed)

## 2018-01-24 NOTE — Progress Notes (Signed)
Patient: Steven Barrett Male    DOB: 14-May-1970   47 y.o.   MRN: [id number] Visit Date: 01/25/2018  Today's Provider: Trey Sailors, [name]   Chief Complaint  Patient presents with  . Hypertension   Subjective:    HPI  Follow up for blood pressure  The patient was last seen for this 4 days ago. Changes made at last visit include start amlodipine 5 mg. Has not taken amlodipine today yet.   He reports excellent compliance with treatment. He feels that condition is Unchanged. He is not having side effects.   He reports he is still having sensation of dizziness. He describes this as happening inconsistently when he is on the road. He does not have dizziness when turning over in bed. He does not have dizziness associated with nausea, vomiting, or imbalance. He does not described dizziness as the room spinning or he himself feeling off balance.   BP Readings from Last 3 Encounters:  01/24/18 (!) 132/98  01/20/18 (!) 160/100  01/13/18 (!) 141/91   His wife who is present today in clinic reports a longstanding history of significant snoring and witnessed apnea. She has to wake him up at points for him to reposition. He has never had sleep study done.   ------------------------------------------------------------------------------------       No Known Allergies   Current Outpatient Medications:  .  amLODipine (NORVASC) 10 MG tablet, Take 1 tablet (10 mg total) by mouth daily., Disp: 90 tablet, Rfl: 3 .  mupirocin ointment (BACTROBAN) 2 %, Apply 1 application topically 3 (three) times daily., Disp: 30 g, Rfl: 1  Review of Systems  Constitutional: Negative.   Respiratory: Positive for shortness of breath.   Cardiovascular: Negative.     Social History   Tobacco Use  . Smoking status: Never Smoker  . Smokeless tobacco: Current User    Types: Chew  Substance Use Topics  . Alcohol use: Yes    Alcohol/week: 0.0 standard drinks    Comment: occasionally    Objective:   BP (!) 132/98 (BP Location: Left Arm, Patient Position: Sitting, Cuff Size: Large)   Pulse 76   Temp 97.9 F (36.6 C) (Oral)   Resp 16   Wt 208 lb (94.3 kg)   SpO2 98%   BMI 29.01 kg/m  Vitals:   01/24/18 1223  BP: (!) 132/98  Pulse: 76  Resp: 16  Temp: 97.9 F (36.6 C)  TempSrc: Oral  SpO2: 98%  Weight: 208 lb (94.3 kg)    Physical Exam  Constitutional: He is oriented to person, place, and time. He appears well-developed and well-nourished.  Eyes: EOM are normal.  Cardiovascular: Normal rate and regular rhythm.  Pulmonary/Chest: Effort normal and breath sounds normal.  Neurological: He is alert and oriented to person, place, and time.  Skin: Skin is warm and dry.  Psychiatric: He has a normal mood and affect. His behavior is normal.   Results of the Epworth flowsheet 01/24/2018  Sitting and reading 2  Watching TV 3  Sitting, inactive in a public place (e.g. a theatre or a meeting) 1  As a passenger in a car for an hour without a break 2  Lying down to rest in the afternoon when circumstances permit 2  Sitting and talking to someone 0  Sitting quietly after a lunch without alcohol 0  In a car, while stopped for a few minutes in traffic 0  Total score 10  Assessment & Plan:     1. Hypertension, unspecified type  Increase amlodipine to 5 mg daily as he has had good response but BP still uncontrolled.   - amLODipine (NORVASC) 10 MG tablet; Take 1 tablet (10 mg total) by mouth daily.  Dispense: 90 tablet; Refill: 3  2. Dizziness  Would like to have him evaluated for sleep apnea, see below. Work note provided, will fill out FMLA as faxed.  3. Suspected sleep apnea  Epworth today is 10.   - Home sleep test  Return in about 1 week (around 01/31/2018) for HTN.  The entirety of the information documented in the History of Present Illness, Review of Systems and Physical Exam were personally obtained by me. Portions of this information were  initially documented by Rondel Baton, CMA and reviewed by me for thoroughness and accuracy.         Trey Sailors, [name]  [facility]

## 2018-01-25 NOTE — Patient Instructions (Signed)

## 2018-01-26 ENCOUNTER — Telehealth: Payer: Self-pay

## 2018-01-26 ENCOUNTER — Telehealth: Payer: Self-pay | Admitting: Physician Assistant

## 2018-01-26 DIAGNOSIS — R0683 Snoring: Secondary | ICD-10-CM

## 2018-01-26 DIAGNOSIS — R0681 Apnea, not elsewhere classified: Secondary | ICD-10-CM

## 2018-01-26 NOTE — Telephone Encounter (Signed)
Order for home sleep study faxed to [facility] °

## 2018-01-26 NOTE — Telephone Encounter (Signed)
Patient was advised via vm (per DPR). 

## 2018-01-26 NOTE — Telephone Encounter (Signed)
He is following up on sleep study referral

## 2018-01-26 NOTE — Telephone Encounter (Signed)
Orders signed for sleep study.

## 2018-01-26 NOTE — Telephone Encounter (Signed)
Theres no message

## 2018-01-31 ENCOUNTER — Encounter: Payer: Self-pay | Admitting: Physician Assistant

## 2018-01-31 ENCOUNTER — Ambulatory Visit: Payer: BLUE CROSS/BLUE SHIELD | Admitting: Physician Assistant

## 2018-01-31 VITALS — BP 138/92 | HR 74 | Temp 98.0°F | Resp 16 | Wt 212.0 lb

## 2018-01-31 DIAGNOSIS — R29818 Other symptoms and signs involving the nervous system: Secondary | ICD-10-CM

## 2018-01-31 DIAGNOSIS — I10 Essential (primary) hypertension: Secondary | ICD-10-CM

## 2018-01-31 MED ORDER — LISINOPRIL 10 MG PO TABS: 10.0000 mg | ORAL_TABLET | Freq: Every day | ORAL | 0 refills | Status: DC

## 2018-01-31 NOTE — Progress Notes (Signed)
       Patient: Steven CosierRonald Mccumbers Male    DOB: [birth year]   47 y.o.   MRN: [id number] Visit Date: 01/31/2018  Today's Provider: Trey SailorsAdriana M Pollak, [name]   Chief Complaint  Patient presents with  . Hypertension   Subjective:    HPI  Follow up for hypertension  The patient was last seen for this 1 weeks ago. Changes made at last visit include increased amlodipine to 10 mg daily.  He reports excellent compliance with treatment. He feels that condition is Unchanged. He is not having side effects.   BP Readings from Last 3 Encounters:  01/31/18 (!) 138/92  01/24/18 (!) 132/98  01/20/18 (!) 160/100   Suspected Sleep Apnea: Home sleep study administrators have been out to house to set up study and will be completing in several days.  ------------------------------------------------------------------------------------       No Known Allergies   Current Outpatient Medications:  .  amLODipine (NORVASC) 10 MG tablet, Take 1 tablet (10 mg total) by mouth daily., Disp: 90 tablet, Rfl: 3 .  mupirocin ointment (BACTROBAN) 2 %, Apply 1 application topically 3 (three) times daily., Disp: 30 g, Rfl: 1  Review of Systems  Constitutional: Negative.   Respiratory: Negative.   Cardiovascular: Negative.     Social History   Tobacco Use  . Smoking status: Never Smoker  . Smokeless tobacco: Current User    Types: Chew  Substance Use Topics  . Alcohol use: Yes    Alcohol/week: 0.0 standard drinks    Comment: occasionally   Objective:   BP (!) 138/92 (BP Location: Left Arm, Patient Position: Sitting, Cuff Size: Large)   Pulse 74   Temp 98 F (36.7 C) (Oral)   Resp 16   Wt 212 lb (96.2 kg)   BMI 29.57 kg/m  Vitals:   01/31/18 1216  BP: (!) 138/92  Pulse: 74  Resp: 16  Temp: 98 F (36.7 C)  TempSrc: Oral  Weight: 212 lb (96.2 kg)     Physical Exam  Constitutional: He is oriented to person, place, and time. He appears well-developed and well-nourished.    Cardiovascular: Normal rate and regular rhythm.  Pulmonary/Chest: Effort normal and breath sounds normal.  Neurological: He is alert and oriented to person, place, and time.  Skin: Skin is warm and dry.  Psychiatric: He has a normal mood and affect. His behavior is normal.        Assessment & Plan:     1. Hypertension, unspecified type  Continue amlodipine 10 mg daily. BP still uncontrolled and will add Lisinopril. See him back in one month or sooner if needed.   - lisinopril (PRINIVIL,ZESTRIL) 10 MG tablet; Take 1 tablet (10 mg total) by mouth daily.  Dispense: 90 tablet; Refill: 0  2. Suspected sleep apnea  Sleep study pending. Do wonder if this is contributing to his blood pressure and would like to see how his blood pressure responds to CPAP should he need it.   Return in about 1 month (around 03/02/2018) for HTN, sleep apnea .  The entirety of the information documented in the History of Present Illness, Review of Systems and Physical Exam were personally obtained by me. Portions of this information were initially documented by Rondel BatonSulibeya Dimas, CMA and reviewed by me for thoroughness and accuracy.          Trey SailorsAdriana M Pollak, [name]  [facility]

## 2018-02-01 DIAGNOSIS — G4733 Obstructive sleep apnea (adult) (pediatric): Secondary | ICD-10-CM | POA: Diagnosis not present

## 2018-02-01 DIAGNOSIS — R0602 Shortness of breath: Secondary | ICD-10-CM | POA: Diagnosis not present

## 2018-02-01 NOTE — Patient Instructions (Signed)

## 2018-02-02 DIAGNOSIS — G4733 Obstructive sleep apnea (adult) (pediatric): Secondary | ICD-10-CM | POA: Diagnosis not present

## 2018-02-02 DIAGNOSIS — R0602 Shortness of breath: Secondary | ICD-10-CM | POA: Diagnosis not present

## 2018-02-06 ENCOUNTER — Telehealth: Payer: Self-pay | Admitting: Physician Assistant

## 2018-02-06 NOTE — Telephone Encounter (Signed)
I haven't gotten any FMLA paperwork for him. Can they resend it?

## 2018-02-06 NOTE — Telephone Encounter (Signed)
Pt calling back regarding FMLA paperwork.  Needing to ask some questions.  Thanks, [employer]

## 2018-02-08 NOTE — Telephone Encounter (Signed)
Forms received today.

## 2018-02-10 ENCOUNTER — Telehealth: Payer: Self-pay

## 2018-02-10 DIAGNOSIS — I10 Essential (primary) hypertension: Secondary | ICD-10-CM | POA: Diagnosis not present

## 2018-02-10 DIAGNOSIS — R1084 Generalized abdominal pain: Secondary | ICD-10-CM | POA: Diagnosis not present

## 2018-02-10 DIAGNOSIS — R109 Unspecified abdominal pain: Secondary | ICD-10-CM | POA: Diagnosis not present

## 2018-02-10 DIAGNOSIS — R21 Rash and other nonspecific skin eruption: Secondary | ICD-10-CM | POA: Diagnosis not present

## 2018-02-10 DIAGNOSIS — K381 Appendicular concretions: Secondary | ICD-10-CM | POA: Diagnosis not present

## 2018-02-10 DIAGNOSIS — R6883 Chills (without fever): Secondary | ICD-10-CM | POA: Diagnosis not present

## 2018-02-10 DIAGNOSIS — R197 Diarrhea, unspecified: Secondary | ICD-10-CM | POA: Diagnosis not present

## 2018-02-10 DIAGNOSIS — R112 Nausea with vomiting, unspecified: Secondary | ICD-10-CM | POA: Diagnosis not present

## 2018-02-10 DIAGNOSIS — R1033 Periumbilical pain: Secondary | ICD-10-CM | POA: Diagnosis not present

## 2018-02-10 DIAGNOSIS — Z79899 Other long term (current) drug therapy: Secondary | ICD-10-CM | POA: Diagnosis not present

## 2018-02-10 NOTE — Telephone Encounter (Signed)
Called patient to follow up on telephone advice call from last night at 11:49 pm.  Patient reports he has been sick all night and his wife prefers he go to the ER. sd Adriana advised. sd

## 2018-02-13 ENCOUNTER — Telehealth: Payer: Self-pay | Admitting: Physician Assistant

## 2018-02-13 DIAGNOSIS — G4733 Obstructive sleep apnea (adult) (pediatric): Secondary | ICD-10-CM | POA: Diagnosis not present

## 2018-02-13 NOTE — Telephone Encounter (Signed)
Pt went to ER on Friday - Nov. 22 and had a lot of fluid in his colon.  Pt has had severe diahrea and pain in stomach.  ER is telling pt he is needing to have a colonoscopy and more tests done.  Asked pt to let his PCP know.   Please advise.  Thanks, [employer]

## 2018-02-15 NOTE — Telephone Encounter (Signed)
Noted, thanks!

## 2018-02-21 ENCOUNTER — Ambulatory Visit (INDEPENDENT_AMBULATORY_CARE_PROVIDER_SITE_OTHER): Payer: BLUE CROSS/BLUE SHIELD | Admitting: Physician Assistant

## 2018-02-21 ENCOUNTER — Encounter: Payer: Self-pay | Admitting: Physician Assistant

## 2018-02-21 VITALS — BP 130/80 | HR 62 | Temp 98.0°F | Resp 16 | Wt 204.6 lb

## 2018-02-21 DIAGNOSIS — Z800 Family history of malignant neoplasm of digestive organs: Secondary | ICD-10-CM

## 2018-02-21 DIAGNOSIS — I10 Essential (primary) hypertension: Secondary | ICD-10-CM

## 2018-02-21 DIAGNOSIS — R42 Dizziness and giddiness: Secondary | ICD-10-CM

## 2018-02-21 DIAGNOSIS — G4730 Sleep apnea, unspecified: Secondary | ICD-10-CM

## 2018-02-21 DIAGNOSIS — A0811 Acute gastroenteropathy due to Norwalk agent: Secondary | ICD-10-CM

## 2018-02-21 NOTE — Patient Instructions (Signed)

## 2018-02-21 NOTE — Progress Notes (Signed)
Patient: Steven CosierRonald Runquist Male    DOB: 11-13-1970   47 y.o.   MRN: [id number] Visit Date: 02/21/2018  Today's Provider: Trey SailorsAdriana M Jevante Hollibaugh, [name]   Chief Complaint  Patient presents with  . Follow-up    HTN and ED visit.   Subjective:    HPI Patient here today to discuss disability paper work and reports that he went to the ED on 02/10/18 and was treated for chills. Reports that he had chills and had a CT scan done and showed he has fluids in his colon. He was diagnosed with norovirus at this time. Patient would like a referral to GI to have a Colonoscopy done. Family hx of colon cancer in his mother, diagnosed at age 46.   Patient also reports that he blood pressures are still running high and and he has been feeling dizzy off and on. Reports that his blood pressure readings have been 145-155/80's. Patient diet is "unhealthy". He is not adherent to low salt diet. Denies chest pain, leg swelling, SOB. He had 10 mg lisinopril added to 10 mg amlodipine.   In the interim, he has had a sleep study completed and he was positive for obstructive sleep apnea. He has received his CPAP machine and patient reports that he is sleeping better with his cpap machine. He reports going through some adjustment phase with his mask being too small and then leaking but he feels he has sorted that out now.  He continues to be dizzy. Can be dizzy at rest, but had one episode of dizziness with head movement while driving. He reports becoming dizzy being a passenger in the car with his wife.   BP Readings from Last 3 Encounters:  02/21/18 130/80  01/31/18 (!) 138/92  01/24/18 (!) 132/98       No Known Allergies   Current Outpatient Medications:  .  amLODipine (NORVASC) 10 MG tablet, Take 1 tablet (10 mg total) by mouth daily., Disp: 90 tablet, Rfl: 3 .  lisinopril (PRINIVIL,ZESTRIL) 10 MG tablet, Take 1 tablet (10 mg total) by mouth daily., Disp: 90 tablet, Rfl: 0 .  mupirocin ointment (BACTROBAN) 2 %,  Apply 1 application topically 3 (three) times daily., Disp: 30 g, Rfl: 1  Review of Systems  Cardiovascular: Negative for chest pain, palpitations and leg swelling.  Neurological: Positive for dizziness.    Social History   Tobacco Use  . Smoking status: Never Smoker  . Smokeless tobacco: Current User    Types: Chew  Substance Use Topics  . Alcohol use: Yes    Alcohol/week: 0.0 standard drinks    Comment: occasionally   Objective:   BP 130/80 (BP Location: Right Arm, Patient Position: Sitting, Cuff Size: Large)   Pulse 62   Temp 98 F (36.7 C) (Oral)   Resp 16   Wt 204 lb 9.6 oz (92.8 kg)   SpO2 98%   BMI 28.54 kg/m  Vitals:   02/21/18 1537  BP: 130/80  Pulse: 62  Resp: 16  Temp: 98 F (36.7 C)  TempSrc: Oral  SpO2: 98%  Weight: 204 lb 9.6 oz (92.8 kg)     Physical Exam  Constitutional: He is oriented to person, place, and time. He appears well-developed and well-nourished.  Cardiovascular: Normal rate and regular rhythm.  Pulmonary/Chest: Effort normal and breath sounds normal.  Abdominal: Soft. Bowel sounds are normal.  Neurological: He is alert and oriented to person, place, and time.  Skin: Skin is warm and dry.  Psychiatric: He has a normal mood and affect. His behavior is normal.        Assessment & Plan:     1. Hypertension, unspecified type  Hypertension is controlled on amlodipine 10 mg daily and lisinopril 10 mg daily. Reviewed CMET from care everywhere encounter at [facility] and it is acceptable after initiation of Lisinopril.   2. Family history of colon cancer  Mother with colon cancer at age 8, will need earlier colonoscopy.   - Ambulatory referral to Gastroenterology  3. Norovirus  - Ambulatory referral to Gastroenterology  4. 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   - Ambulatory referral to ENT  5. Sleep apnea, unspecified type  Continue with CPAP, he is using this nightly and it gives him  relief from daytime fatigue.   Return in about 4 months (around 06/23/2018) for hypertension .  The entirety of the information documented in the History of Present Illness, Review of Systems and Physical Exam were personally obtained by me. Portions of this information were initially documented by Hetty Ely, CMA and reviewed by me for thoroughness and accuracy.             Trey Sailors, [name]  [facility]

## 2018-02-22 DIAGNOSIS — G4730 Sleep apnea, unspecified: Secondary | ICD-10-CM | POA: Insufficient documentation

## 2018-02-28 ENCOUNTER — Ambulatory Visit: Payer: BLUE CROSS/BLUE SHIELD | Admitting: Family Medicine

## 2018-03-02 ENCOUNTER — Ambulatory Visit: Payer: Self-pay | Admitting: Physician Assistant

## 2018-03-02 ENCOUNTER — Telehealth: Payer: Self-pay | Admitting: Physician Assistant

## 2018-03-02 NOTE — Telephone Encounter (Signed)
Noted, thanks!

## 2018-03-02 NOTE — Telephone Encounter (Signed)
Letting Ricki Rodriguezdriana know pt got an ENT appt on Dec 19th.  Thanks, [employer]

## 2018-03-06 ENCOUNTER — Telehealth: Payer: Self-pay | Admitting: Physician Assistant

## 2018-03-06 NOTE — Telephone Encounter (Signed)
Pt is calling back 3rd time to check on status.  Hasn't heard from anyone.  Thanks, [employer]

## 2018-03-06 NOTE — Telephone Encounter (Signed)
I have not received any extension of FMLA paperwork, I remember family telling me this would be sent but I have been watching fax and have not received it.

## 2018-03-06 NOTE — Telephone Encounter (Signed)
Patient's wife, Morrie Sheldonshley, needs to talk to someone regarding the FMLA paperwork for Ron.

## 2018-03-07 NOTE — Telephone Encounter (Signed)
Pt calling back regarding faxed paperwork.  Please call pt back asap.  Thanks, [employer]

## 2018-03-07 NOTE — Telephone Encounter (Signed)
We just received it. Once provider is done with patient and gets a chance to fill it out we will contact patient.

## 2018-03-07 NOTE — Telephone Encounter (Signed)
Spoke with wife and informed her we have not received any paper work and to ask the company to refax it.

## 2018-03-07 NOTE — Telephone Encounter (Signed)
Patient advised that records have been faxed.  Thanks,  -Theophile Harvie

## 2018-03-09 DIAGNOSIS — R42 Dizziness and giddiness: Secondary | ICD-10-CM | POA: Diagnosis not present

## 2018-03-09 DIAGNOSIS — H9319 Tinnitus, unspecified ear: Secondary | ICD-10-CM | POA: Diagnosis not present

## 2018-03-14 ENCOUNTER — Telehealth: Payer: Self-pay

## 2018-03-14 NOTE — Telephone Encounter (Signed)
Patient was advised that his FMLA paperwork was faxed and his provider extended him to be out of work until 03/29/2018 and will not do another extension per provider.

## 2018-03-14 NOTE — Telephone Encounter (Signed)
Patients wife had called office to check on status of FMLA form, I informed her that telephone from encounter 03/07/18 states that this form was faxed. Wife states that husband had spoke with CMA in office ( she did not remember her name) and told them that a new form would have to be sent over. According to wife CMA told her this would be filled out and done today. She is requesting call back. KW

## 2018-03-15 DIAGNOSIS — G4733 Obstructive sleep apnea (adult) (pediatric): Secondary | ICD-10-CM | POA: Diagnosis not present

## 2018-03-23 ENCOUNTER — Ambulatory Visit: Payer: BLUE CROSS/BLUE SHIELD | Admitting: Physician Assistant

## 2018-03-24 ENCOUNTER — Ambulatory Visit: Payer: BLUE CROSS/BLUE SHIELD | Admitting: Physician Assistant

## 2018-03-24 ENCOUNTER — Ambulatory Visit
Admission: RE | Admit: 2018-03-24 | Discharge: 2018-03-24 | Disposition: A | Discharge status: Home / Self Care | Payer: BLUE CROSS/BLUE SHIELD | Source: Ambulatory Visit | Attending: Physician Assistant | Admitting: Physician Assistant

## 2018-03-24 ENCOUNTER — Encounter: Payer: Self-pay | Admitting: Physician Assistant

## 2018-03-24 ENCOUNTER — Other Ambulatory Visit: Payer: Self-pay | Admitting: Physician Assistant

## 2018-03-24 VITALS — BP 136/86 | HR 62 | Temp 97.5°F | Wt 208.6 lb

## 2018-03-24 DIAGNOSIS — R42 Dizziness and giddiness: Secondary | ICD-10-CM

## 2018-03-24 DIAGNOSIS — K388 Other specified diseases of appendix: Secondary | ICD-10-CM | POA: Diagnosis not present

## 2018-03-24 DIAGNOSIS — R1033 Periumbilical pain: Secondary | ICD-10-CM

## 2018-03-24 DIAGNOSIS — K389 Disease of appendix, unspecified: Secondary | ICD-10-CM

## 2018-03-24 HISTORY — DX: Essential (primary) hypertension: I10

## 2018-03-24 LAB — POCT I-STAT CREATININE: Creatinine, Ser: 1 mg/dL (ref 0.61–1.24)

## 2018-03-24 MED ORDER — IOHEXOL 300 MG/ML  SOLN
100.0000 mL | Freq: Once | INTRAMUSCULAR | Status: AC | PRN
  Administered 2018-03-24: 100 mL via INTRAVENOUS

## 2018-03-24 NOTE — Progress Notes (Signed)
Patient: Steven Barrett Male    DOB: [birth year]   48 y.o.   MRN: [id number] Visit Date: 03/24/2018  Today's Provider: Trey Sailors, [name]   Chief Complaint  Patient presents with  . Dizziness   Subjective:    Patient presents today for dizziness and abdominal pain. Dizziness has been ongoing issue for several months - describes as "feeling off" at certain moments when looking up or at heights. He denies room spinning, nausea, vomiting. Orthostatic vitals have been normal. HTN and sleep apnea have been remedied and dizziness persists. ENT evaluation was negative for vertigo.   He also reports abdominal pain x 2 days. He has family history significant for colon cancer in his mother age 76's who has since died of the illness. On 03-03-2018 He was evaluated at [facility] ER for abdominal pain and vomiting. GI panel revealed norovirus. CT abdomen revealed the following:   - Multiple appendicoliths measuring up to 2.5 cm with mild appendiceal wall thickening and mild appendiceal dilation distally however without significant periappendiceal inflammatory changes. These findings could be secondary chronic inflammatory changes since there is no evidence periappendiceal inflammatory changes. Early acute appendicitis is not excluded based on imaging. Clinical correlation is recommended.   - Fluid-filled ascending colon and rectum, likely correlating with reported recent diarrhea.  He was not treated with any antibiotics, he was sent home after admin of IV fluids. Did appear to improve for a time. Now with periumbilical abdominal pain x 2 days and nausea without vomiting. Denies bloody stools, diarrhea is per his baseline. Denies fevers, chills. Has eaten at 8:30 this morning.   Dizziness  This is a recurrent problem. The problem occurs intermittently. The problem has been unchanged. Associated symptoms include abdominal pain.  GI Problem  The primary symptoms include abdominal pain and diarrhea. The  illness began more than 7 days ago. The problem has been gradually worsening.  The abdominal pain is generalized.  Associated symptoms comments: Weight gain .   Wt Readings from Last 3 Encounters:  03/24/18 208 lb 9.6 oz (94.6 kg)  02/21/18 204 lb 9.6 oz (92.8 kg)  01/31/18 212 lb (96.2 kg)    Had keflex in august and doxycycline in 01/13/2018   No Known Allergies   Current Outpatient Medications:  .  amLODipine (NORVASC) 10 MG tablet, Take 1 tablet (10 mg total) by mouth daily., Disp: 90 tablet, Rfl: 3 .  lisinopril (PRINIVIL,ZESTRIL) 10 MG tablet, Take 1 tablet (10 mg total) by mouth daily., Disp: 90 tablet, Rfl: 0 .  mupirocin ointment (BACTROBAN) 2 %, Apply 1 application topically 3 (three) times daily., Disp: 30 g, Rfl: 1  Review of Systems  Constitutional: Positive for unexpected weight change.  Respiratory: Negative.   Cardiovascular: Negative.   Gastrointestinal: Positive for abdominal pain and diarrhea.  Musculoskeletal: Negative.   Neurological: Positive for dizziness.    Social History   Tobacco Use  . Smoking status: Never Smoker  . Smokeless tobacco: Current User    Types: Chew  Substance Use Topics  . Alcohol use: Yes    Alcohol/week: 0.0 standard drinks    Comment: occasionally      Objective:   BP 136/86 (BP Location: Left Arm, Patient Position: Sitting, Cuff Size: Large)   Pulse 62   Temp (!) 97.5 F (36.4 C) (Oral)   Wt 208 lb 9.6 oz (94.6 kg)   SpO2 97%   BMI 29.09 kg/m  Vitals:   03/24/18 1204  BP: 136/86  Pulse: 62  Temp: (!) 97.5 F (36.4 C)  TempSrc: Oral  SpO2: 97%  Weight: 208 lb 9.6 oz (94.6 kg)     Physical Exam Constitutional:      Appearance: Normal appearance.  Cardiovascular:     Rate and Rhythm: Normal rate and regular rhythm.     Pulses: Normal pulses.  Pulmonary:     Effort: Pulmonary effort is normal.     Breath sounds: Normal breath sounds.  Abdominal:     General: Bowel sounds are normal. There is  distension.     Palpations: There is no mass.     Tenderness: There is abdominal tenderness.     Hernia: No hernia is present.     Comments: Some slight periumbilical tenderness to palpation. No RLQ tenderness.   Skin:    General: Skin is warm and dry.  Neurological:     General: No focal deficit present.     Mental Status: He is alert and oriented to person, place, and time. Mental status is at baseline.  Psychiatric:        Mood and Affect: Mood normal.        Behavior: Behavior normal.         Assessment & Plan    1. Dizziness  Cause of dizziness unclear but symptoms persist per patient. Orthostatic vitals normal. Cardiac workup including EKG and stress test has been negative. ENT negative workup for vertigo. HTN and sleep apnea are currently being treated and are controlled. He has had history of GI infection + family hx of colon cancer and is seeing GI on 03/29/2018. Push fluids. Talked about considering neurology referral. He would like to attend GI appointment before going to neurology. He has had DOT physical at the end of last month and they are aware he is out of work. 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   2. Periumbilical abdominal pain  Abdominal CT showed multiple apendicoliths and mild appendiceal thickening and dilation on 02/10/2018. Unable to r/o appendicitis on imaging at that time. Patient reporting periumbilical pain at this point with nausea that is travelling towards his right lower quadrant.  - CT Abdomen Pelvis W Contrast; Future  Return if symptoms worsen or fail to improve.  The entirety of the information documented in the History of Present Illness, Review of Systems and Physical Exam were personally obtained by me. Portions of this information were initially documented by Martyn EhrichLatasha Walston, CMA and reviewed by me for thoroughness and accuracy.          Trey SailorsAdriana M Dalonte Hardage, [name]  [facility]

## 2018-03-24 NOTE — Progress Notes (Unsigned)
surge

## 2018-03-24 NOTE — Patient Instructions (Signed)

## 2018-03-28 ENCOUNTER — Other Ambulatory Visit: Payer: Self-pay

## 2018-03-28 ENCOUNTER — Telehealth: Payer: Self-pay

## 2018-03-28 ENCOUNTER — Encounter: Payer: Self-pay | Admitting: Surgery

## 2018-03-28 ENCOUNTER — Ambulatory Visit (INDEPENDENT_AMBULATORY_CARE_PROVIDER_SITE_OTHER): Payer: BLUE CROSS/BLUE SHIELD | Admitting: Surgery

## 2018-03-28 ENCOUNTER — Other Ambulatory Visit
Admission: RE | Admit: 2018-03-28 | Discharge: 2018-03-28 | Disposition: A | Discharge status: Home / Self Care | Payer: BLUE CROSS/BLUE SHIELD | Attending: Surgery | Admitting: Surgery

## 2018-03-28 ENCOUNTER — Telehealth: Payer: Self-pay | Admitting: Physician Assistant

## 2018-03-28 VITALS — BP 137/91 | HR 53 | Temp 96.0°F | Resp 16 | Ht 71.0 in | Wt 208.0 lb

## 2018-03-28 DIAGNOSIS — K389 Disease of appendix, unspecified: Secondary | ICD-10-CM

## 2018-03-28 LAB — CBC WITH DIFFERENTIAL/PLATELET
Abs Immature Granulocytes: 0.01 10*3/uL (ref 0.00–0.07)
Basophils Absolute: 0 10*3/uL (ref 0.0–0.1)
Basophils Relative: 0 %
Eosinophils Absolute: 0.1 10*3/uL (ref 0.0–0.5)
Eosinophils Relative: 1 %
HEMATOCRIT: 46.4 % (ref 39.0–52.0)
Hemoglobin: 16.2 g/dL (ref 13.0–17.0)
Immature Granulocytes: 0 %
LYMPHS ABS: 1.5 10*3/uL (ref 0.7–4.0)
Lymphocytes Relative: 27 %
MCH: 31.5 pg (ref 26.0–34.0)
MCHC: 34.9 g/dL (ref 30.0–36.0)
MCV: 90.3 fL (ref 80.0–100.0)
Monocytes Absolute: 0.5 10*3/uL (ref 0.1–1.0)
Monocytes Relative: 10 %
NEUTROS ABS: 3.3 10*3/uL (ref 1.7–7.7)
Neutrophils Relative %: 62 %
Platelets: 210 10*3/uL (ref 150–400)
RBC: 5.14 MIL/uL (ref 4.22–5.81)
RDW: 12.5 % (ref 11.5–15.5)
WBC: 5.4 10*3/uL (ref 4.0–10.5)
nRBC: 0 % (ref 0.0–0.2)

## 2018-03-28 LAB — COMPREHENSIVE METABOLIC PANEL
ALT: 31 U/L (ref 0–44)
AST: 29 U/L (ref 15–41)
Albumin: 4.5 g/dL (ref 3.5–5.0)
Alkaline Phosphatase: 105 U/L (ref 38–126)
Anion gap: 7 (ref 5–15)
BUN: 13 mg/dL (ref 6–20)
CO2: 26 mmol/L (ref 22–32)
Calcium: 9 mg/dL (ref 8.9–10.3)
Chloride: 105 mmol/L (ref 98–111)
Creatinine, Ser: 0.9 mg/dL (ref 0.61–1.24)
GFR calc Af Amer: >60 mL/min (ref >60)
GFR calc non Af Amer: >60 mL/min (ref >60)
GLUCOSE: 108 mg/dL — AB (ref 70–99)
Potassium: 4 mmol/L (ref 3.5–5.1)
Sodium: 138 mmol/L (ref 135–145)
Total Bilirubin: 1.4 mg/dL — ABNORMAL HIGH (ref 0.3–1.2)
Total Protein: 7.6 g/dL (ref 6.5–8.1)

## 2018-03-28 NOTE — Telephone Encounter (Signed)
Patient advised as below. That forms have been faxed.s d

## 2018-03-28 NOTE — Progress Notes (Signed)
03/28/2018  Reason for Visit:  Abnormal appendix  History of Present Illness: Steven Barrett is a 47 y.o. male with a two month history of abnormal appendix on CT scan.  He had initially presented to [facility] on 02/10/18 with a 2 day history of abdominal pain and diarrhea.  He had CT scan which showed a dilated appendix with multiple appendicoliths, measuring as much as 2.5 cm width.  However, he did not have any significant periappendiceal stranding or inflammation, and his stool studies were positive for Norovirus.  He was given IV fluids and discharged to home after a short stay in hospital.  He then presented to his PCP on 03/24/18.  He reported dizziness, and continued diarrhea.  He had a repeat CT scan which again showed a dilated appendix with appendicoliths, and no stranding.  He reports he's been dealing with diarrhea for many years.  His wife today mentions that his neck and ears will get flushed from time to time, but it is unclear as to how often or duration or if this has happened more than once.  His pain is not in the right lower quadrant but more in the periumbilical area.  While at the ED in November, he was reporting pain going over both sides of umbilicus.  Of note, he does have a family history of colon cancer in his father.  Past Medical History: Past Medical History:  Diagnosis Date  . Hypertension      Past Surgical History: Past Surgical History:  Procedure Laterality Date  . KNEE SURGERY Right   . NO PAST SURGERIES      Home Medications: Prior to Admission medications   Medication Sig Start Date End Date Taking? Authorizing Provider  amLODipine (NORVASC) 10 MG tablet Take 1 tablet (10 mg total) by mouth daily. 01/24/18  Yes Pollak, [name], [name]  lisinopril (PRINIVIL,ZESTRIL) 10 MG tablet Take 1 tablet (10 mg total) by mouth daily. 01/31/18  Yes Pollak, [name], [name]    Allergies: No Known Allergies  Social History:  reports that he has never smoked. His  smokeless tobacco use includes chew. He reports current alcohol use. He reports that he does not use drugs.   Family History: Family History  Problem Relation Age of Onset  . Colon cancer Mother   . Diabetes Father   . Pancreatitis Father     Review of Systems: Review of Systems  Constitutional: Negative for chills and fever.  HENT: Negative for hearing loss.   Respiratory: Negative for shortness of breath.   Cardiovascular: Negative for chest pain.  Gastrointestinal: Positive for abdominal pain and diarrhea. Negative for constipation, nausea and vomiting.  Genitourinary: Negative for dysuria.  Musculoskeletal: Negative for myalgias.  Skin: Positive for rash.  Neurological: Positive for dizziness.  Psychiatric/Behavioral: Negative for depression.    Physical Exam BP (!) 137/91   Pulse (!) 53   Temp (!) 96 F (35.6 C) (Oral)   Resp 16   Ht 5' 11" (1.803 m)   Wt 208 lb (94.3 kg)   SpO2 99%   BMI 29.01 kg/m  CONSTITUTIONAL: No acute distress HEENT:  Normocephalic, atraumatic, extraocular motion intact. NECK: Trachea is midline, and there is no jugular venous distension.  RESPIRATORY:  Lungs are clear, and breath sounds are equal bilaterally. Normal respiratory effort without pathologic use of accessory muscles. CARDIOVASCULAR: Heart is regular without murmurs, gallops, or rubs. GI: The abdomen is soft, obese, non-distended, currently with some discomfort above the umbilicus in the midline.    No right lower quadrant tenderness. There were no palpable masses. MUSCULOSKELETAL:  Normal muscle strength and tone in all four extremities.  No peripheral edema or cyanosis. SKIN: Skin turgor is normal. There are no pathologic skin lesions.  NEUROLOGIC:  Motor and sensation is grossly normal.  Cranial nerves are grossly intact. PSYCH:  Alert and oriented to person, place and time. Affect is normal.  Laboratory Analysis: No results found for this or any previous visit (from the past 24  hour(s)).  Imaging: CT scan 03/24/18: IMPRESSION: Enlarged appendix measuring 2.6 cm in diameter containing 2 appendicoliths with the larger measuring 1.7 cm in the proximal appendix. No significant adjacent free fluid or inflammatory change. 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  2 mm nonobstructing right renal stone.  Assessment and Plan: This is a 48 y.o. male with abnormal appendix on CT scans x 2, with longstanding history of diarrhea, and more recent history of dizziness that has him out of work temporarily.  Discussed with the patient that the appendix on his CT scan is abnormal and warrants further exploration and testing.  He could potentially have a mucocele.  Though unusual, he also has diarrhea and some type of flushing which could indicate carcinoid.  He also does have a family history of colon cancer.  On his CT scan there is no inflammation and at this point I am less concerned for an acute process like appendicitis that he would require antibiotics and urgent surgery.  He has an appointment tomorrow with Dr. Maximino Greenland with GI for colonoscopy consult.  Will send her a message to try expedite his colonoscopy.  Will also send for CBC, CMP, and 24-hour urine 5-HIAA.  Once we have the results and the colonoscopy results, we'll have him follow up with me to discuss surgery.   Did discuss with him that he does warrant appendectomy, and there is a possibility that he could need a right colectomy based on pathology.  At this point we need further tests to make a better determination of his surgical plan.  Face-to-face time spent with the patient and care providers was 60 minutes, with more than 50% of the time spent counseling, educating, and coordinating care of the patient.     [name], MD [facility]

## 2018-03-28 NOTE — Patient Instructions (Addendum)
Please stop by the lab on the first floor before leaving today for blood work and a 24 Hour Urine study. Please follow directions for the Urine study that will be provided.   The patient is [facility] with Dr Aleen CampiPiscoya on 03/30/18. He will pre admit by phone. He will call the day prior for his arrival time.       We have scheduled you for an elective Appendectomy 03/30/2018 with Dr.Piscoya at [facility].  Please see the following information regarding your surgery.   Typically, our patients are out of work 1-2 weeks following the surgery and may return with a lifting restriction of no more than 15 lbs. For a complete 6 weeks following surgery. If you need us to fill out any FMLA or disability paperwork for your employer, please submit that to our office.     Laparoscopic Appendectomy, Adult, Care After Refer to this sheet in the next few weeks. These instructions provide you with information about caring for yourself after your procedure. Your health care provider may also give you more specific instructions. Your treatment has been planned according to current medical practices, but problems sometimes occur. Call your health care provider if you have any problems or questions after your procedure. What can I expect after the procedure? After the procedure, it is common to have:  A decrease in your energy level.  Mild pain in the area where the surgical cuts (incisions) were made.  Constipation. This can be caused by pain medicine and a decrease in your activity.  Follow these instructions at home: Medicines  Take over-the-counter and prescription medicines only as told by your health care provider.  Do not drive for 24 hours if you received a sedative.  Do not drive or operate heavy machinery while taking prescription pain medicine.  If you were prescribed an antibiotic medicine, take it as told by your health care provider. Do not stop taking the antibiotic  even if you start to feel better. Activity  For 3 weeks or as long as told by your health care provider: ? Do not lift anything that is heavier than 10 pounds (4.5 kg). ? Do not play contact sports.  Gradually return to your normal activities. Ask your health care provider what activities are safe for you. Bathing  Keep your incisions clean and dry. Clean them as often as told by your health care provider: ? Gently wash the incisions with soap and water. ? Rinse the incisions with water to remove all soap. ? Pat the incisions dry with a clean towel. Do not rub the incisions.  You may take showers after 48 hours.  Do not take baths, swim, or use hot tubs for 2 weeks or as told by your health care provider. Incision care  Follow instructions from your healthcare provider about how to take care of your incisions. Make sure you: ? Wash your hands with soap and water before you change your bandage (dressing). If soap and water are not available, use hand sanitizer. ? Change your dressing as told by your health care provider. ? Leave stitches (sutures), skin glue, or adhesive strips in place. These skin closures may need to stay in place for 2 weeks or longer. If adhesive strip edges start to loosen and curl up, you may trim the loose edges. Do not remove adhesive strips completely unless your health care provider tells you to do that.  Check your incision areas every day for signs of  infection. Check for: ? More redness, swelling, or pain. ? More fluid or blood. ? Warmth. ? Pus or a bad smell. Other Instructions  If you were sent home with a drain, follow instructions from your health care provider about how to care for the drain and how to empty it.  Take deep breaths. This helps to prevent your lungs from becoming inflamed.  To relieve and prevent constipation: ? Drink plenty of fluids. ? Eat plenty of fruits and vegetables.  Keep all follow-up visits as told by your health care  provider. This is important. Contact a health care provider if:  You have more redness, swelling, or pain around an incision.  You have more fluid or blood coming from an incision.  Your incision feels warm to the touch.  You have pus or a bad smell coming from an incision or dressing.  Your incision edges break open after your sutures have been removed.  You have increasing pain in your shoulders.  You feel dizzy or you faint.  You develop shortness of breath.  You keep feeling nauseous or vomiting.  You have diarrhea or you cannot control your bowel functions.  You lose your appetite.  You develop swelling or pain in your legs. Get help right away if:  You have a fever.  You develop a rash.  You have difficulty breathing.  You have sharp pains in your chest. This information is not intended to replace advice given to you by your health care provider. Make sure you discuss any questions you have with your health care provider. Document Released: 03/08/2005 Document Revised: 08/08/2015 Document Reviewed: 08/26/2014 Elsevier Interactive Patient Education  2017 [employer].

## 2018-03-28 NOTE — H&P (View-Only) (Signed)
03/28/2018  Reason for Visit:  Abnormal appendix  History of Present Illness: Steven Barrett is a 48 y.o. male with a two month history of abnormal appendix on CT scan.  He had initially presented to [facility] on 02/10/18 with a 2 day history of abdominal pain and diarrhea.  He had CT scan which showed a dilated appendix with multiple appendicoliths, measuring as much as 2.5 cm width.  However, he did not have any significant periappendiceal stranding or inflammation, and his stool studies were positive for Norovirus.  He was given IV fluids and discharged to home after a short stay in hospital.  He then presented to his PCP on 03/24/18.  He reported dizziness, and continued diarrhea.  He had a repeat CT scan which again showed a dilated appendix with appendicoliths, and no stranding.  He reports he's been dealing with diarrhea for many years.  His wife today mentions that his neck and ears will get flushed from time to time, but it is unclear as to how often or duration or if this has happened more than once.  His pain is not in the right lower quadrant but more in the periumbilical area.  While at the ED in November, he was reporting pain going over both sides of umbilicus.  Of note, he does have a family history of colon cancer in his father.  Past Medical History: Past Medical History:  Diagnosis Date  . Hypertension      Past Surgical History: Past Surgical History:  Procedure Laterality Date  . KNEE SURGERY Right   . NO PAST SURGERIES      Home Medications: Prior to Admission medications   Medication Sig Start Date End Date Taking? Authorizing Provider  amLODipine (NORVASC) 10 MG tablet Take 1 tablet (10 mg total) by mouth daily. 01/24/18  Yes Osvaldo Angst M, [name]  lisinopril (PRINIVIL,ZESTRIL) 10 MG tablet Take 1 tablet (10 mg total) by mouth daily. 01/31/18  Yes Trey Sailors, [name]    Allergies: No Known Allergies  Social History:  reports that he has never smoked. His  smokeless tobacco use includes chew. He reports current alcohol use. He reports that he does not use drugs.   Family History: Family History  Problem Relation Age of Onset  . Colon cancer Mother   . Diabetes Father   . Pancreatitis Father     Review of Systems: Review of Systems  Constitutional: Negative for chills and fever.  HENT: Negative for hearing loss.   Respiratory: Negative for shortness of breath.   Cardiovascular: Negative for chest pain.  Gastrointestinal: Positive for abdominal pain and diarrhea. Negative for constipation, nausea and vomiting.  Genitourinary: Negative for dysuria.  Musculoskeletal: Negative for myalgias.  Skin: Positive for rash.  Neurological: Positive for dizziness.  Psychiatric/Behavioral: Negative for depression.    Physical Exam BP (!) 137/91   Pulse (!) 53   Temp (!) 96 F (35.6 C) (Oral)   Resp 16   Ht 5\' 11"  (1.803 m)   Wt 208 lb (94.3 kg)   SpO2 99%   BMI 29.01 kg/m  CONSTITUTIONAL: No acute distress HEENT:  Normocephalic, atraumatic, extraocular motion intact. NECK: Trachea is midline, and there is no jugular venous distension.  RESPIRATORY:  Lungs are clear, and breath sounds are equal bilaterally. Normal respiratory effort without pathologic use of accessory muscles. CARDIOVASCULAR: Heart is regular without murmurs, gallops, or rubs. GI: The abdomen is soft, obese, non-distended, currently with some discomfort above the umbilicus in the midline.  No right lower quadrant tenderness. There were no palpable masses. MUSCULOSKELETAL:  Normal muscle strength and tone in all four extremities.  No peripheral edema or cyanosis. SKIN: Skin turgor is normal. There are no pathologic skin lesions.  NEUROLOGIC:  Motor and sensation is grossly normal.  Cranial nerves are grossly intact. PSYCH:  Alert and oriented to person, place and time. Affect is normal.  Laboratory Analysis: No results found for this or any previous visit (from the past 24  hour(s)).  Imaging: CT scan 03/24/18: IMPRESSION: Enlarged appendix measuring 2.6 cm in diameter containing 2 appendicoliths with the larger measuring 1.7 cm in the proximal appendix. No significant adjacent free fluid or inflammatory change. 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  2 mm nonobstructing right renal stone.  Assessment and Plan: This is a 48 y.o. male with abnormal appendix on CT scans x 2, with longstanding history of diarrhea, and more recent history of dizziness that has him out of work temporarily.  Discussed with the patient that the appendix on his CT scan is abnormal and warrants further exploration and testing.  He could potentially have a mucocele.  Though unusual, he also has diarrhea and some type of flushing which could indicate carcinoid.  He also does have a family history of colon cancer.  On his CT scan there is no inflammation and at this point I am less concerned for an acute process like appendicitis that he would require antibiotics and urgent surgery.  He has an appointment tomorrow with Dr. Maximino Greenland with GI for colonoscopy consult.  Will send her a message to try expedite his colonoscopy.  Will also send for CBC, CMP, and 24-hour urine 5-HIAA.  Once we have the results and the colonoscopy results, we'll have him follow up with me to discuss surgery.   Did discuss with him that he does warrant appendectomy, and there is a possibility that he could need a right colectomy based on pathology.  At this point we need further tests to make a better determination of his surgical plan.  Face-to-face time spent with the patient and care providers was 60 minutes, with more than 50% of the time spent counseling, educating, and coordinating care of the patient.     [name], MD [facility]

## 2018-03-28 NOTE — Telephone Encounter (Signed)
[facility] faxed over extension paperwork yesterday.   [employer] is waiting on Adriana to fax back short term extension paperwork. Pt needing a call back to let him know this was done. Pt is needing this for back pay.   Thanks, [employer]

## 2018-03-28 NOTE — Telephone Encounter (Signed)
Left message for patient to return call. Per Dr.Piscoya no food /drink prior to your appointment today.   Spoke with wife and she will let her husband know to not eat or drink anything today.

## 2018-03-29 ENCOUNTER — Other Ambulatory Visit
Admission: RE | Admit: 2018-03-29 | Discharge: 2018-03-29 | Disposition: A | Discharge status: Home / Self Care | Payer: BLUE CROSS/BLUE SHIELD | Attending: Surgery | Admitting: Surgery

## 2018-03-29 ENCOUNTER — Encounter: Payer: Self-pay | Admitting: Gastroenterology

## 2018-03-29 ENCOUNTER — Telehealth: Payer: Self-pay | Admitting: *Deleted

## 2018-03-29 ENCOUNTER — Encounter

## 2018-03-29 ENCOUNTER — Ambulatory Visit (INDEPENDENT_AMBULATORY_CARE_PROVIDER_SITE_OTHER): Payer: BLUE CROSS/BLUE SHIELD | Admitting: Gastroenterology

## 2018-03-29 VITALS — BP 122/75 | HR 61 | Ht 71.0 in | Wt 209.6 lb

## 2018-03-29 DIAGNOSIS — R197 Diarrhea, unspecified: Secondary | ICD-10-CM

## 2018-03-29 DIAGNOSIS — K388 Other specified diseases of appendix: Secondary | ICD-10-CM

## 2018-03-29 DIAGNOSIS — K389 Disease of appendix, unspecified: Secondary | ICD-10-CM | POA: Insufficient documentation

## 2018-03-29 NOTE — Telephone Encounter (Signed)
Per Caryl-Lyn, surgery pending lab results and patient aware per Dr. Aleen CampiPiscoya.

## 2018-03-29 NOTE — Telephone Encounter (Signed)
Patient called and stated that he saw Dr.Tahiliani and she stated that we should go ahead and proceed with rescheduling the patients surgery with Dr.Piscoya.

## 2018-03-30 ENCOUNTER — Ambulatory Visit: Admission: RE | Admit: 2018-03-30 | Payer: BLUE CROSS/BLUE SHIELD | Source: Home / Self Care | Admitting: Surgery

## 2018-03-30 ENCOUNTER — Ambulatory Visit: Payer: Self-pay | Admitting: Surgery

## 2018-03-30 ENCOUNTER — Encounter: Admission: RE | Payer: Self-pay | Source: Home / Self Care

## 2018-03-30 SURGERY — APPENDECTOMY, LAPAROSCOPIC
Anesthesia: General

## 2018-03-30 NOTE — Progress Notes (Signed)
Steven Barrett [address]  [address]  [place], [place] [address]  Main: [phone]  Fax: [phone]   Gastroenterology Consultation  Referring Provider:     Dr. Ernesto Rutherford Primary Care Physician:  Trey Sailors, [name] Reason for Consultation:    Diarrhea, evaluation for colonoscopy        HPI:    Chief Complaint  Patient presents with  . New Patient (Initial Visit)    referred by A. Pollak for fhx colon cancer, pt c/o epigastric pain, occ. nausea, diarrhea every day x10 ys    Steven Barrett is a 48 y.o. y/o male referred for consultation & management  by Dr. Ernesto Rutherford.  Patient reports 3 to 85-month history of diarrhea, abdominal bloating, which led to imaging that showed enlarged appendix measuring 2.6 cm containing appendicoliths.  Dr. Aleen Campi has messaged me via epic messaging and referred him to [name] to evaluate for a colonoscopy prior to surgical resection to rule out any other colonic lesions.  He reports along with his diarrhea he has had dull periumbilical abdominal pain for the last 6 months, intermittent, not related to meals, nonradiating.  No weight loss.  Does also report intermittent associated flushing.  Denies any dysphagia or nausea vomiting. Patient denies any previous history of colonoscopy.  Denies any hematochezia or melena.  No family history of colon cancer in immediate family members.    Past Medical History:  Diagnosis Date  . Hypertension     Past Surgical History:  Procedure Laterality Date  . KNEE SURGERY Right   . NO PAST SURGERIES      Prior to Admission medications   Medication Sig Start Date End Date Taking? Authorizing Provider  amLODipine (NORVASC) 10 MG tablet Take 1 tablet (10 mg total) by mouth daily. 01/24/18  Yes Osvaldo Angst M, [name]  lisinopril (PRINIVIL,ZESTRIL) 10 MG tablet Take 1 tablet (10 mg total) by mouth daily. 01/31/18  Yes Trey Sailors, [name]    Family History  Problem Relation Age of Onset  .  Colon cancer Mother   . Diabetes Father   . Pancreatitis Father      Social History   Tobacco Use  . Smoking status: Never Smoker  . Smokeless tobacco: Current User    Types: Chew  Substance Use Topics  . Alcohol use: Yes    Alcohol/week: 0.0 standard drinks    Comment: occasionally  . Drug use: No    Allergies as of 03/29/2018  . (No Known Allergies)    Review of Systems:    All systems reviewed and negative except where noted in HPI.   Physical Exam:  BP 122/75   Pulse 61   Ht 5\' 11"  (1.803 m)   Wt 209 lb 9.6 oz (95.1 kg)   BMI 29.23 kg/m  No LMP for male patient. Psych:  Alert and cooperative. Normal mood and affect. General:   Alert,  Well-developed, well-nourished, pleasant and cooperative in NAD Head:  Normocephalic and atraumatic. Eyes:  Sclera clear, no icterus.   Conjunctiva pink. Ears:  Normal auditory acuity. Nose:  No deformity, discharge, or lesions. Mouth:  No deformity or lesions,oropharynx pink & moist. Neck:  Supple; no masses or thyromegaly. Abdomen:  Normal bowel sounds.  No bruits.  Soft, non-tender and non-distended without masses, hepatosplenomegaly or hernias noted.  No guarding or rebound tenderness.    Msk:  Symmetrical without gross deformities. Good, equal movement & strength bilaterally. Pulses:  Normal pulses noted. Extremities:  No  clubbing or edema.  No cyanosis. Neurologic:  Alert and oriented x3;  grossly normal neurologically. Skin:  Intact without significant lesions or rashes. No jaundice. Lymph Nodes:  No significant cervical adenopathy. Psych:  Alert and cooperative. Normal mood and affect.   Labs: CBC    Component Value Date/Time   WBC 5.4 03/28/2018 1328   RBC 5.14 03/28/2018 1328   HGB 16.2 03/28/2018 1328   HGB 16.0 09/25/2014 1527   HCT 46.4 03/28/2018 1328   HCT 45.9 09/25/2014 1527   PLT 210 03/28/2018 1328   PLT 214 09/25/2014 1527   MCV 90.3 03/28/2018 1328   MCV 90 09/25/2014 1527   MCH 31.5 03/28/2018  1328   MCHC 34.9 03/28/2018 1328   RDW 12.5 03/28/2018 1328   RDW 13.7 09/25/2014 1527   LYMPHSABS 1.5 03/28/2018 1328   MONOABS 0.5 03/28/2018 1328   EOSABS 0.1 03/28/2018 1328   BASOSABS 0.0 03/28/2018 1328   CMP     Component Value Date/Time   NA 138 03/28/2018 1328   K 4.0 03/28/2018 1328   CL 105 03/28/2018 1328   CO2 26 03/28/2018 1328   GLUCOSE 108 (H) 03/28/2018 1328   BUN 13 03/28/2018 1328   CREATININE 0.90 03/28/2018 1328   CALCIUM 9.0 03/28/2018 1328   PROT 7.6 03/28/2018 1328   ALBUMIN 4.5 03/28/2018 1328   AST 29 03/28/2018 1328   ALT 31 03/28/2018 1328   ALKPHOS 105 03/28/2018 1328   BILITOT 1.4 (H) 03/28/2018 1328   GFRNONAA >60 03/28/2018 1328   GFRAA >60 03/28/2018 1328    Imaging Studies: Ct Abdomen Pelvis W Contrast  Addendum Date: 03/24/2018   ADDENDUM REPORT: 03/24/2018 16:44 ADDENDUM: I discussed the findings with Adriana Pollack PA at 4:13 p.m. 03/24/2018. She stated patient had a CT scan within the past 2 months at a [facility] facility describing similar findings related to the appendix. We discussed that this likely represents a chronic process and talked about possibility of chronic appendicitis or other chronic appendiceal conditions such as a mucocele. Electronically Signed   By: Daniel  Boyle M.D.   On: 03/24/2018 16:44   Result Date: 03/24/2018 CLINICAL DATA:  Bilateral lower abdominal pain periumbilical region 2 days. Central abdominal pain in the umbilical region 1 month ago. Diarrhea. Evaluate for appendicitis. EXAM: CT ABDOMEN AND PELVIS WITH CONTRAST TECHNIQUE: Multidetector CT imaging of the abdomen and pelvis was performed using the standard protocol following bolus administration of intravenous contrast. CONTRAST:  <MEASUREM83 Del MontAmbrRosezetta [phone] InBellWindsor [facility] M6Liborio <MEASUREMEN87 MAmbrRosezetta [phone]-[place] MemMercy Medical Center-CentervilleMa4Liborio <MEASUREMEN3 AmbrRosezetta [phone] Milwaukie Hospitalnc1Liborio <MEASUREMEN7032 MayfaAmbrRosezetta [phone] (Va Nebraska WesteSanUspi Memorial Surgery CenterAc4Liborio <MEASUREMEN301 S. LogAmbrRosezetta [phone]-[place] HSamVa Central Iowa Healthcare SystemorLiborio <MEASUREMEN77C TrAmbrRosezetta [phone] Specialty87Wilson-ConocoMemorial Hospitalhe3Liborio <MEASUREMEN8810 West WAmbrRosezetta SchlatMar813-Grossmont HospitalTr2Liborio <MEASUREMEN413 E. CheAmbrRosezetta [phone]-St. DavidTalbert Surgical AssociatesSE5Liborio <MEASUREMEN24 AddisoAmbrRosezetta [phone]-ShSelect Specialty Hospital Central ParpLiborio <MEASUREMEN586 MayfAmbrRosezetta [phone] Center Asc LLCnv4Liborio <MEASUREMEN57 FoxruAmbrRosezetta [phone] 356-[place] B9344 NorthNazareth HospitalAp7Liborio <MEASUREMEN197 CharAmbrRosezetta SchlatMarYouNorman Specialty Hospitaltv3Liborio <MEASUREMEN528 RiAmbrRosezetta [phone] MRock Springsun5Liborio <MEASUREMEN7097 PineknoAmbrRosezetta [phone]-[facility]habil68St.The Aesthetic Surgery Centre PLLCGLiborio <MEASUREMEN9887 Wild RAmbrRosezetta SchlatMar([phone])KingChristus Mother Frances Hospital - South Tyler P8Liborio <MEASUREMEN538 ColoniAmbrRosezetta [phone]-CooBonita Surgery Center Of Cliffside LLCprLiborio <MEASUREMEN571 MarlborouAmbrRosezetta [phone]-[place] Bea8OceanCleveland Clinic Rehabilitation Hospital, Edwin ShawPo5Liborio <MEASUREMEN7831 WAmbrRosezetta [phone] 392 TaravVallArkansas Continued Care Hospital Of Jonesboroy 8Liborio <MEASUREMEN84 Middle RiveAmbrRosezetta SchlatMar234-7[facility]0M3Liborio <MEASUREMEN46 AmbrRosezetta [phone]-EWhitman Hospital And Medical Centerdi3Liborio <MEASUREMEN821 East BoAmbrRosezetta [phone] County Regional Hospitalsh6Liborio <MEASUREMEN319 E. WentwoAmbrRosezetta [phone] 6Shriners[facility]ville1LLiborio <MEASUREMEN360 East HomeAmbrRosezetta SchlatMar415 [phone]-Child SCypress Fairbanks Medical CenterBa4Liborio NixonorKatha Hamming CenterMG/ML  SOLN COMPARISON:  None. FINDINGS: Lower chest: Lung bases are normal. Hepatobiliary: Liver, gallbladder and biliary tree are normal. Pancreas: Normal. Spleen: Normal. Adrenals/Urinary Tract: Adrenal glands  are normal. Kidneys are normal in size without evidence of hydronephrosis. There is a 2 mm nonobstructing stone over the mid to lower pole right kidney. Ureters and bladder are normal. Stomach/Bowel: Stomach and small bowel are normal. The appendix is enlarged measuring approximately 2.6 cm containing contrast surrounding a 1.7 cm appendicolith proximally. There is a second 1.1 cm appendicolith distally. There is no adjacent free fluid or inflammatory change. There is no free peritoneal air. Colon is unremarkable. Vascular/Lymphatic: Normal. Reproductive: Normal. Other: None. Musculoskeletal: Mild degenerative change over the lower lumbar spine and hips. IMPRESSION: Enlarged appendix measuring 2.6 cm in diameter containing 2 appendicoliths with the larger measuring 1.7 cm in the proximal appendix. No significant adjacent free fluid or inflammatory change. 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 2 mm nonobstructing right renal stone. Electronically Signed: By: Elberta Fortisaniel  Boyle M.D. On: 03/24/2018 15:57    Assessment and Plan:   Steven Barrett is a 48 y.o. y/o male has been referred by Dr. Aleen CampiPiscoya for evaluation of colonoscopy prior to surgical resection for dilated appendix, also with to 277-month history of periumbilical abdominal pain and diarrhea  I extensively reviewed patient's previous notes by surgery, imaging report, and reviewed his chart along with [facility] and discussed the below with Dr. Aleen CampiPiscoya as well.  As per uptodate:   "A CT finding of isolated focal distal appendiceal dilatation with a segment of morphologically normal appendix proximally has been strongly associated with underlying neoplastic appendiceal mucoceles [31]. Other neoplastic features include mural calcification, diameter of >2 cm, and absence of periappendiceal stranding. Although colonic polyps are seen  frequently in patients with appendix cancer, appendiceal abnormalities are seen infrequently with colonoscopy and rarely yield a diagnostic biopsy [32]. As a result, for patients with an incidentally detected appendiceal mucocele on abdominal CT, further evaluation with a colonoscopy is usually not necessary unless there is CT evidence of cecal involvement. Patients may proceed with appendectomy."   (https://www.hale.com/https://www.uptodate.com/contents/appendiceal-mucinous-lesions?search=appendiceal%6620mucocele&sectionRank=1&usage_type=default&anchor=H1692103542&source=machineLearning&selectedTitle=1~4&display_rank=1#[email or web address])  In addition to the above, appendicoliths can occur due to chronic appendicitis.  In the setting of possible chronic appendicitis and appendiceal dilation seen on CT scans, colonoscopy would have high risks and benefits.  Colonoscopy requires insufflation of the colon with CO2 or air which can lead to complications in the setting of a dilated appendix.  Therefore, putting patient through a colonoscopy which according to up-to-date is not necessary in the setting of a possible appendiceal mucocele, would have higher risks than benefits.  Any colon polyps found during the colonoscopy prior to surgery would be incidental findings, and can be evaluated after his surgery at which time the procedure would have lesser risk than it would prior to the surgery.  I discussed the above extensively with the patient and his wife, and I also discussed that the CT has not shown any other masses anywhere else in his colon.  There is always a small possibility that he may have a large colon polyp or lesion elsewhere in his colon that may show up in his future colonoscopy requiring further surgery.  However, this risk is low, colorectal cancer screenings are recommended after 2245 or 47 years of age, and putting him through a colonoscopy in the setting of high risk appendiceal finding on CT, is not justified by the very  small possibility of any other underlying colon lesions.  Patient and wife are agreeable with the above and did not want to go through a colonoscopy that has high risks at this time.  We also discussed that his diarrhea and flushing may improve after his surgical resection and if it does not he will need further work-up and should follow-up in our clinic after his surgery and he is agreeable to this.  In addition, an appendiceal carcinoid tumor is unable to be identified during a colonoscopy.  Follow-up with Dr. Aleen CampiPiscoya closely in clinic.  Dr Steven BouillonVarnita Mauria Asquith  Speech recognition software was used to dictate the above note.

## 2018-03-31 ENCOUNTER — Telehealth: Payer: Self-pay

## 2018-03-31 NOTE — Telephone Encounter (Signed)
Per Dr Aleen Campi OK to go ahead and reschedule surgery. Should have 5 HIAA results back by Monday.  The patient is scheduled for surgery at [facility] on 04/05/18 with Dr Aleen Campi. He will pre admit by phone. The patient is aware of date and instructions to call the day before for his arrival time.

## 2018-04-03 NOTE — Addendum Note (Signed)
Addended by: Myrtie Hawk on: 04/03/2018 04:37 PM   Modules accepted: Orders, SmartSet

## 2018-04-04 ENCOUNTER — Other Ambulatory Visit: Payer: Self-pay

## 2018-04-04 ENCOUNTER — Telehealth: Payer: Self-pay

## 2018-04-04 ENCOUNTER — Encounter
Admission: RE | Admit: 2018-04-04 | Discharge: 2018-04-04 | Disposition: A | Discharge status: Home / Self Care | Payer: BLUE CROSS/BLUE SHIELD | Source: Ambulatory Visit | Attending: Surgery | Admitting: Surgery

## 2018-04-04 HISTORY — DX: Sleep apnea, unspecified: G47.30

## 2018-04-04 HISTORY — DX: Hordeolum externum unspecified eye, unspecified eyelid: H00.019

## 2018-04-04 MED ORDER — SODIUM CHLORIDE 0.9 % IV SOLN
2.0000 g | INTRAVENOUS | Status: AC
  Administered 2018-04-05: 2 g via INTRAVENOUS
  Filled 2018-04-04: qty 2

## 2018-04-04 NOTE — Telephone Encounter (Signed)
Spoke with  Donte in the lab regarding 24 hour urine. There was an instrument issue and urine test could not be performed. Urine was received 03/30/18 and this particular test is not run on weekends and there is a four day turn around. Donte stated the results would be in chart by Thursday 04/06/2018.

## 2018-04-04 NOTE — Pre-Procedure Instructions (Signed)
Value Ref Range  PMHR 174 bpm  Exercise duration (sec) 22 sec  Baseline HR 66 bpm  Baseline BP 147/104 mmHg  PMHR % 93 %  Exercise duration (min) 10 min  Estimated workload 13.4 METS  Peak HR 162 bpm  Peak BP 224/67 mmHg  ST Change Segment 1 0.0 mm  ST Deviation 0.0 mm  Stage Reached 5   Angina Score for Duke Treadmill Score 0   Duke Treadmill Score 10.4   Other Result Information  This result has an attachment that is not available.  Result Narrative    No significant ST segment changes or arrhythmias were noted during  stress  Overall, the patient's exercise capacity was excellent.  Negative ETT for ischemia at workload achieved  No significant chest pain symptoms reported  No significant arrhythmia noted  Normal stress test  Status Results Details

## 2018-04-04 NOTE — Pre-Procedure Instructions (Signed)
Component Name Value Ref Range  EKG Systolic BP  mmHg  EKG Diastolic BP  mmHg  EKG Ventricular Rate 49 BPM  EKG Atrial Rate 49 BPM  EKG P-R Interval 132 ms  EKG QRS Duration 90 ms  EKG Q-T Interval 438 ms  EKG QTC Calculation 395 ms  EKG Calculated P Axis 33 degrees  EKG Calculated R Axis 42 degrees  EKG Calculated T Axis 47 degrees  QTC Fredericia 409 ms  Result Narrative  SINUS BRADYCARDIA OTHERWISE NORMAL ECG NO PREVIOUS ECGS AVAILABLE Confirmed by Rose-jones, Lisa ([id number]) on 01/16/2018 8:42:42 AM  Other Result Information  Interface, Rad Results In - 01/16/2018  8:42 AM EDT SINUS BRADYCARDIA OTHERWISE NORMAL ECG NO PREVIOUS ECGS AVAILABLE Confirmed by Rose-jones, Lisa ([id number]) on 01/16/2018 8:42:42 AM  Status Results Details

## 2018-04-04 NOTE — Patient Instructions (Signed)
Your procedure is scheduled on: 04/05/18 Report to Day Surgery. [facility]OND [facility]LOOR To find out your arrival time please call [phone] between 1PM - 3PM on 04/04/18.  Remember: Instructions that are not followed completely may result in serious medical risk,  up to and including death, or upon the discretion of your surgeon and anesthesiologist your  surgery may need to be rescheduled.     _X__ 1. Do not eat food after midnight the night before your procedure.                 No gum chewing or hard candies. You may drink clear liquids up to 2 hours                 before you are scheduled to arrive for your surgery- DO not drink clear                 liquids within 2 hours of the start of your surgery.                 Clear Liquids include:  water, apple juice without pulp, clear carbohydrate                 drink such as Clearfast of Gatorade, Black Coffee or Tea (Do not add                 anything to coffee or tea).  __X__2.  On the morning of surgery brush your teeth with toothpaste and water, you                may rinse your mouth with mouthwash if you wish.  Do not swallow any toothpaste of mouthwash.     _X__ 3.  No Alcohol for 24 hours before or after surgery.   _X__ 4.  Do Not Smoke or use e-cigarettes For 24 Hours Prior to Your Surgery.                 Do not use any chewable tobacco products for at least 6 hours prior to                 surgery.  ____  5.  Bring all medications with you on the day of surgery if instructed.   __X__  6.  Notify your doctor if there is any change in your medical condition      (cold, fever, infections).     Do not wear jewelry, make-up, hairpins, clips or nail polish. Do not wear lotions, powders, or perfumes. You may wear deodorant. Do not shave 48 hours prior to surgery. Men may shave face and neck. Do not bring valuables to the hospital.    [facility] is not responsible for any belongings or  valuables.  Contacts, dentures or bridgework may not be worn into surgery. Leave your suitcase in the car. After surgery it may be brought to your room. For patients admitted to the hospital, discharge time is determined by your treatment team.   Patients discharged the day of surgery will not be allowed to drive home.      _X___ Take these medicines the morning of surgery with A SIP OF WATER:    1. AMLODIPINE  2.   3.   4.  5.  6.  ____ Fleet Enema (as directed)   ____ Use CHG Soap as directed  ____ Use inhalers on the day of surgery  ____ Stop metformin 2 days prior to  surgery    ____ Take 1/2 of usual insulin dose the night before surgery. No insulin the morning          of surgery.   ____ Stop Coumadin/Plavix/aspirin on   ____ Stop Anti-inflammatories on    ____ Stop supplements until after surgery.    _X___ Bring C-Pap to the hospital.

## 2018-04-04 NOTE — Telephone Encounter (Signed)
Spoke with Aram Beecham in [place] lab regarding 24 hour urine results- she will return my call as she is checking on the results.

## 2018-04-05 ENCOUNTER — Ambulatory Visit
Admission: RE | Admit: 2018-04-05 | Discharge: 2018-04-05 | Disposition: A | Discharge status: Home / Self Care | Payer: BLUE CROSS/BLUE SHIELD | Attending: Surgery | Admitting: Surgery

## 2018-04-05 ENCOUNTER — Encounter
Admission: RE | Disposition: A | Discharge status: Home / Self Care | Payer: Self-pay | Source: Home / Self Care | Attending: Surgery

## 2018-04-05 ENCOUNTER — Ambulatory Visit: Payer: BLUE CROSS/BLUE SHIELD | Admitting: Certified Registered Nurse Anesthetist

## 2018-04-05 ENCOUNTER — Other Ambulatory Visit: Payer: Self-pay

## 2018-04-05 DIAGNOSIS — K389 Disease of appendix, unspecified: Secondary | ICD-10-CM | POA: Diagnosis not present

## 2018-04-05 DIAGNOSIS — F17220 Nicotine dependence, chewing tobacco, uncomplicated: Secondary | ICD-10-CM | POA: Diagnosis not present

## 2018-04-05 DIAGNOSIS — K381 Appendicular concretions: Secondary | ICD-10-CM | POA: Diagnosis not present

## 2018-04-05 DIAGNOSIS — Z79899 Other long term (current) drug therapy: Secondary | ICD-10-CM | POA: Insufficient documentation

## 2018-04-05 DIAGNOSIS — Z800 Family history of malignant neoplasm of digestive organs: Secondary | ICD-10-CM | POA: Diagnosis not present

## 2018-04-05 DIAGNOSIS — I10 Essential (primary) hypertension: Secondary | ICD-10-CM | POA: Insufficient documentation

## 2018-04-05 DIAGNOSIS — K36 Other appendicitis: Secondary | ICD-10-CM | POA: Diagnosis not present

## 2018-04-05 HISTORY — PX: LAPAROSCOPIC APPENDECTOMY: SHX408

## 2018-04-05 SURGERY — APPENDECTOMY, LAPAROSCOPIC
Anesthesia: General

## 2018-04-05 MED ORDER — SUGAMMADEX SODIUM 200 MG/2ML IV SOLN
INTRAVENOUS | Status: AC
  Filled 2018-04-05: qty 2

## 2018-04-05 MED ORDER — MIDAZOLAM HCL 2 MG/2ML IJ SOLN
INTRAMUSCULAR | Status: DC | PRN
  Administered 2018-04-05: 2 mg via INTRAVENOUS

## 2018-04-05 MED ORDER — ONDANSETRON HCL 4 MG/2ML IJ SOLN
INTRAMUSCULAR | Status: DC | PRN
  Administered 2018-04-05: 4 mg via INTRAVENOUS

## 2018-04-05 MED ORDER — OXYCODONE HCL 5 MG PO TABS: 5.0000 mg | ORAL_TABLET | ORAL | 0 refills | Status: DC | PRN

## 2018-04-05 MED ORDER — FAMOTIDINE 20 MG PO TABS
20.0000 mg | ORAL_TABLET | Freq: Once | ORAL | Status: AC
  Administered 2018-04-05: 20 mg via ORAL

## 2018-04-05 MED ORDER — NICOTINE 14 MG/24HR TD PT24: 14.0000 mg | MEDICATED_PATCH | Freq: Every day | TRANSDERMAL | 0 refills | Status: AC

## 2018-04-05 MED ORDER — OXYCODONE HCL 5 MG PO TABS
ORAL_TABLET | ORAL | Status: AC
  Administered 2018-04-05: 5 mg via ORAL
  Filled 2018-04-05: qty 1

## 2018-04-05 MED ORDER — PROMETHAZINE HCL 25 MG/ML IJ SOLN: 6.2500 mg | INTRAMUSCULAR | Status: DC | PRN

## 2018-04-05 MED ORDER — LIDOCAINE HCL (PF) 2 % IJ SOLN
INTRAMUSCULAR | Status: AC
  Filled 2018-04-05: qty 10

## 2018-04-05 MED ORDER — OXYCODONE HCL 5 MG PO TABS
5.0000 mg | ORAL_TABLET | Freq: Once | ORAL | Status: AC | PRN
  Administered 2018-04-05: 5 mg via ORAL

## 2018-04-05 MED ORDER — SUGAMMADEX SODIUM 200 MG/2ML IV SOLN
INTRAVENOUS | Status: DC | PRN
  Administered 2018-04-05: 200 mg via INTRAVENOUS

## 2018-04-05 MED ORDER — MIDAZOLAM HCL 2 MG/2ML IJ SOLN
INTRAMUSCULAR | Status: AC
  Filled 2018-04-05: qty 2

## 2018-04-05 MED ORDER — DEXAMETHASONE SODIUM PHOSPHATE 10 MG/ML IJ SOLN
INTRAMUSCULAR | Status: DC | PRN
  Administered 2018-04-05: 10 mg via INTRAVENOUS

## 2018-04-05 MED ORDER — PROPOFOL 10 MG/ML IV BOLUS
INTRAVENOUS | Status: AC
  Filled 2018-04-05: qty 20

## 2018-04-05 MED ORDER — EPHEDRINE SULFATE 50 MG/ML IJ SOLN
INTRAMUSCULAR | Status: DC | PRN
  Administered 2018-04-05 (×2): 5 mg via INTRAVENOUS

## 2018-04-05 MED ORDER — FENTANYL CITRATE (PF) 100 MCG/2ML IJ SOLN
INTRAMUSCULAR | Status: DC | PRN
  Administered 2018-04-05: 100 ug via INTRAVENOUS

## 2018-04-05 MED ORDER — SEVOFLURANE IN SOLN
RESPIRATORY_TRACT | Status: AC
  Filled 2018-04-05: qty 250

## 2018-04-05 MED ORDER — SUCCINYLCHOLINE CHLORIDE 20 MG/ML IJ SOLN
INTRAMUSCULAR | Status: AC
  Filled 2018-04-05: qty 1

## 2018-04-05 MED ORDER — CHLORHEXIDINE GLUCONATE CLOTH 2 % EX PADS: 6.0000 | MEDICATED_PAD | Freq: Once | CUTANEOUS | Status: DC

## 2018-04-05 MED ORDER — ROCURONIUM BROMIDE 50 MG/5ML IV SOLN
INTRAVENOUS | Status: AC
  Filled 2018-04-05: qty 1

## 2018-04-05 MED ORDER — ACETAMINOPHEN 500 MG PO TABS
1000.0000 mg | ORAL_TABLET | ORAL | Status: AC
  Administered 2018-04-05: 1000 mg via ORAL

## 2018-04-05 MED ORDER — KETAMINE HCL 10 MG/ML IJ SOLN
INTRAMUSCULAR | Status: DC | PRN
  Administered 2018-04-05: 30 mg via INTRAVENOUS

## 2018-04-05 MED ORDER — ACETAMINOPHEN 500 MG PO TABS
ORAL_TABLET | ORAL | Status: AC
  Administered 2018-04-05: 1000 mg via ORAL
  Filled 2018-04-05: qty 2

## 2018-04-05 MED ORDER — LACTATED RINGERS IV SOLN
INTRAVENOUS | Status: DC
  Administered 2018-04-05: 12:00:00 via INTRAVENOUS

## 2018-04-05 MED ORDER — FENTANYL CITRATE (PF) 100 MCG/2ML IJ SOLN
INTRAMUSCULAR | Status: AC
  Administered 2018-04-05: 50 ug via INTRAVENOUS
  Filled 2018-04-05: qty 2

## 2018-04-05 MED ORDER — KETAMINE HCL 50 MG/ML IJ SOLN
INTRAMUSCULAR | Status: AC
  Filled 2018-04-05: qty 10

## 2018-04-05 MED ORDER — ONDANSETRON HCL 4 MG/2ML IJ SOLN
INTRAMUSCULAR | Status: AC
  Filled 2018-04-05: qty 2

## 2018-04-05 MED ORDER — GLYCOPYRROLATE 0.2 MG/ML IJ SOLN
INTRAMUSCULAR | Status: DC | PRN
  Administered 2018-04-05: 0.2 mg via INTRAVENOUS

## 2018-04-05 MED ORDER — MEPERIDINE HCL 50 MG/ML IJ SOLN: 6.2500 mg | INTRAMUSCULAR | Status: DC | PRN

## 2018-04-05 MED ORDER — SUCCINYLCHOLINE CHLORIDE 20 MG/ML IJ SOLN
INTRAMUSCULAR | Status: DC | PRN
  Administered 2018-04-05: 120 mg via INTRAVENOUS

## 2018-04-05 MED ORDER — GABAPENTIN 300 MG PO CAPS
300.0000 mg | ORAL_CAPSULE | ORAL | Status: AC
  Administered 2018-04-05: 300 mg via ORAL

## 2018-04-05 MED ORDER — BUPIVACAINE-EPINEPHRINE (PF) 0.5% -1:200000 IJ SOLN
INTRAMUSCULAR | Status: DC | PRN
  Administered 2018-04-05: 30 mL via PERINEURAL

## 2018-04-05 MED ORDER — FENTANYL CITRATE (PF) 100 MCG/2ML IJ SOLN
25.0000 ug | INTRAMUSCULAR | Status: DC | PRN
  Administered 2018-04-05: 50 ug via INTRAVENOUS

## 2018-04-05 MED ORDER — GABAPENTIN 300 MG PO CAPS
ORAL_CAPSULE | ORAL | Status: AC
  Administered 2018-04-05: 300 mg via ORAL
  Filled 2018-04-05: qty 1

## 2018-04-05 MED ORDER — IBUPROFEN 600 MG PO TABS: 600.0000 mg | ORAL_TABLET | Freq: Three times a day (TID) | ORAL | 0 refills | Status: DC | PRN

## 2018-04-05 MED ORDER — DEXAMETHASONE SODIUM PHOSPHATE 10 MG/ML IJ SOLN
INTRAMUSCULAR | Status: AC
  Filled 2018-04-05: qty 1

## 2018-04-05 MED ORDER — FENTANYL CITRATE (PF) 250 MCG/5ML IJ SOLN
INTRAMUSCULAR | Status: AC
  Filled 2018-04-05: qty 5

## 2018-04-05 MED ORDER — OXYCODONE HCL 5 MG/5ML PO SOLN: 5.0000 mg | Freq: Once | ORAL | Status: AC | PRN

## 2018-04-05 MED ORDER — LIDOCAINE HCL (CARDIAC) PF 100 MG/5ML IV SOSY
PREFILLED_SYRINGE | INTRAVENOUS | Status: DC | PRN
  Administered 2018-04-05: 100 mg via INTRAVENOUS

## 2018-04-05 MED ORDER — EPHEDRINE SULFATE 50 MG/ML IJ SOLN
INTRAMUSCULAR | Status: AC
  Filled 2018-04-05: qty 1

## 2018-04-05 MED ORDER — PROPOFOL 10 MG/ML IV BOLUS
INTRAVENOUS | Status: DC | PRN
  Administered 2018-04-05: 200 mg via INTRAVENOUS

## 2018-04-05 MED ORDER — ROCURONIUM BROMIDE 100 MG/10ML IV SOLN
INTRAVENOUS | Status: DC | PRN
  Administered 2018-04-05: 10 mg via INTRAVENOUS
  Administered 2018-04-05: 40 mg via INTRAVENOUS

## 2018-04-05 MED ORDER — ACETAMINOPHEN 10 MG/ML IV SOLN
INTRAVENOUS | Status: AC
  Filled 2018-04-05: qty 100

## 2018-04-05 MED ORDER — FAMOTIDINE 20 MG PO TABS
ORAL_TABLET | ORAL | Status: AC
  Administered 2018-04-05: 20 mg via ORAL
  Filled 2018-04-05: qty 1

## 2018-04-05 SURGICAL SUPPLY — 49 items
CANISTER SUCT 1200ML W/VALVE (MISCELLANEOUS) ×2 IMPLANT
CHLORAPREP W/TINT 26ML (MISCELLANEOUS) ×2 IMPLANT
COVER WAND RF STERILE (DRAPES) ×1 IMPLANT
CUTTER FLEX LINEAR 45M (STAPLE) IMPLANT
DERMABOND ADVANCED (GAUZE/BANDAGES/DRESSINGS) ×1
DERMABOND ADVANCED .7 DNX12 (GAUZE/BANDAGES/DRESSINGS) ×1 IMPLANT
ELECT CAUTERY BLADE 6.4 (BLADE) ×2 IMPLANT
ELECT REM PT RETURN 9FT ADLT (ELECTROSURGICAL) ×2
ELECTRODE REM PT RTRN 9FT ADLT (ELECTROSURGICAL) ×1 IMPLANT
GLOVE BIO SURGEON STRL SZ 6.5 (GLOVE) ×1 IMPLANT
GLOVE BIOGEL PI IND STRL 7.0 (GLOVE) IMPLANT
GLOVE BIOGEL PI INDICATOR 7.0 (GLOVE) ×1
GLOVE SURG SYN 7.0 (GLOVE) ×4 IMPLANT
GLOVE SURG SYN 7.0 PF PI (GLOVE) ×1 IMPLANT
GLOVE SURG SYN 7.5  E (GLOVE) ×2
GLOVE SURG SYN 7.5 E (GLOVE) ×2 IMPLANT
GLOVE SURG SYN 7.5 PF PI (GLOVE) ×1 IMPLANT
GOWN STRL REUS W/ TWL LRG LVL3 (GOWN DISPOSABLE) ×2 IMPLANT
GOWN STRL REUS W/TWL LRG LVL3 (GOWN DISPOSABLE) ×5
IRRIGATION STRYKERFLOW (MISCELLANEOUS) IMPLANT
IRRIGATOR STRYKERFLOW (MISCELLANEOUS)
IV NS 1000ML (IV SOLUTION) ×1
IV NS 1000ML BAXH (IV SOLUTION) ×1 IMPLANT
KIT TURNOVER KIT A (KITS) ×2 IMPLANT
L-HOOK LAP DISP 36CM (ELECTROSURGICAL) ×2
LABEL OR SOLS (LABEL) ×2 IMPLANT
LHOOK LAP DISP 36CM (ELECTROSURGICAL) IMPLANT
LIGASURE LAP MARYLAND 5MM 37CM (ELECTROSURGICAL) ×1 IMPLANT
NEEDLE HYPO 22GX1.5 SAFETY (NEEDLE) ×2 IMPLANT
NS IRRIG 500ML POUR BTL (IV SOLUTION) ×2 IMPLANT
PACK LAP CHOLECYSTECTOMY (MISCELLANEOUS) ×2 IMPLANT
PENCIL ELECTRO HAND CTR (MISCELLANEOUS) ×2 IMPLANT
POUCH SPECIMEN RETRIEVAL 10MM (ENDOMECHANICALS) ×2 IMPLANT
RELOAD 45 VASCULAR/THIN (ENDOMECHANICALS) IMPLANT
RELOAD STAPLE 45 2.5 WHT GRN (ENDOMECHANICALS) IMPLANT
RELOAD STAPLE 45 3.5 BLU ETS (ENDOMECHANICALS) ×1 IMPLANT
RELOAD STAPLE TA45 3.5 REG BLU (ENDOMECHANICALS) ×4 IMPLANT
SCISSORS METZENBAUM CVD 33 (INSTRUMENTS) ×2 IMPLANT
SLEEVE ADV FIXATION 5X100MM (TROCAR) ×4 IMPLANT
SUT MNCRL 4-0 (SUTURE) ×2
SUT MNCRL 4-0 27XMFL (SUTURE) ×2
SUT VIC AB 3-0 SH 27 (SUTURE) ×1
SUT VIC AB 3-0 SH 27X BRD (SUTURE) IMPLANT
SUT VICRYL 0 AB UR-6 (SUTURE) ×2 IMPLANT
SUTURE MNCRL 4-0 27XMF (SUTURE) ×1 IMPLANT
TRAY FOLEY MTR SLVR 16FR STAT (SET/KITS/TRAYS/PACK) ×2 IMPLANT
TROCAR BALLN GELPORT 12X130M (ENDOMECHANICALS) ×2 IMPLANT
TROCAR Z-THREAD OPTICAL 5X100M (TROCAR) ×2 IMPLANT
TUBING INSUFFLATION (TUBING) ×2 IMPLANT

## 2018-04-05 NOTE — Interval H&P Note (Signed)
History and Physical Interval Note:  04/05/2018 12:49 PM  Steven Barrett  has presented today for surgery, with the diagnosis of CHRONIC APPENDICITIS  The various methods of treatment have been discussed with the patient and family. After consideration of risks, benefits and other options for treatment, the patient has consented to  Procedure(s): APPENDECTOMY LAPAROSCOPIC (N/A) as a surgical intervention .  Discussed with the patient that we would send his appendix to pathology intraoperatively and if there is evidence for adenocarcinoma, we would proceed with a right colectomy.  He understands this and is in agreement.  The patient's history has been reviewed, patient examined, no change in status, stable for surgery.  I have reviewed the patient's chart and labs.  Questions were answered to the patient's satisfaction.     [name]

## 2018-04-05 NOTE — Transfer of Care (Signed)
Immediate Anesthesia Transfer of Care Note  Patient: Steven Barrett  Procedure(s) Performed: APPENDECTOMY LAPAROSCOPIC (N/A )  Patient Location: PACU  Anesthesia Type:General  Level of Consciousness: drowsy and patient cooperative  Airway & Oxygen Therapy: Patient Spontanous Breathing and Patient connected to face mask oxygen  Post-op Assessment: Report given to RN and Post -op Vital signs reviewed and stable  Post vital signs: Reviewed and stable  Last Vitals:  Vitals Value Taken Time  BP 122/76 04/05/2018  6:03 PM  Temp 36.4 C 04/05/2018  6:03 PM  Pulse 71 04/05/2018  6:03 PM  Resp 18 04/05/2018  6:03 PM  SpO2 99 % 04/05/2018  6:03 PM    Last Pain:  Vitals:   04/05/18 1803  TempSrc:   PainSc: Asleep         Complications: No apparent anesthesia complications

## 2018-04-05 NOTE — OR Nursing (Signed)
Patient's wife states that patient "chewed tobacco" this  Morning.  Patient states he did not swallow any juices and he spit it out at 8:OO am.  Notified Dr Pernell Dupre. No new orders at this time, patient may proceed to surgery at this time.

## 2018-04-05 NOTE — Anesthesia Procedure Notes (Signed)
Procedure Name: Intubation Date/Time: 04/05/2018 4:00 PM Performed by: Junious SilkNoles, Maxie Slovacek, CRNA Pre-anesthesia Checklist: Patient identified, Patient being monitored, Timeout performed, Emergency Drugs available and Suction available Patient Re-evaluated:Patient Re-evaluated prior to induction Oxygen Delivery Method: Circle system utilized Preoxygenation: Pre-oxygenation with 100% oxygen Induction Type: IV induction Ventilation: Mask ventilation without difficulty Laryngoscope Size: 3 and Miller Grade View: Grade I Tube type: Oral Tube size: 7.5 mm Number of attempts: 1 Airway Equipment and Method: Stylet Placement Confirmation: ETT inserted through vocal cords under direct vision,  positive ETCO2 and breath sounds checked- equal and bilateral Secured at: 22 cm Tube secured with: Tape Dental Injury: Teeth and Oropharynx as per pre-operative assessment

## 2018-04-05 NOTE — Op Note (Signed)
  Procedure Date:  04/05/2018  Pre-operative Diagnosis:   Abnormal appendix  Post-operative Diagnosis:  Abnormal appendix  Procedure:  Laparoscopic appendectomy  Surgeon:  [name], MD  Assistant:  Hulda Marin, MD, and Hervey Ard, PA-S.  Dr. Thelma Barge' assistance was needed because of the potential for a right colectomy given the dilated appendix and potential for an appendiceal mass.  Anesthesia:  General endotracheal  Estimated Blood Loss:  10 ml  Specimens:  appendix  Complications:  None  Indications for Procedure:  This is a 48 y.o. male who presents with abdominal pain and workup revealing an abnormal appendix dilated to 2.6 cm.  The options of surgery versus observation were reviewed with the patient and/or family. The risks of bleeding, infection, recurrence of symptoms, negative laparoscopy, potential for an open procedure, bowel injury, abscess or infection, were all discussed with the patient and he was willing to proceed.  Description of Procedure: The patient was correctly identified in the preoperative area and brought into the operating room.  The patient was placed supine with VTE prophylaxis in place.  Appropriate time-outs were performed.  Anesthesia was induced and the patient was intubated.  Foley catheter was placed.  Appropriate antibiotics were infused.  The abdomen was prepped and draped in a sterile fashion. An supraumbilical incision was made. A cutdown technique was used to enter the abdominal cavity without injury, and a Hasson trocar was inserted.  Pneumoperitoneum was obtained with appropriate opening pressures.  Two 5-mm ports were placed in the suprapubic and left lateral positions under direct visualization.  The right lower quadrant was inspected and the appendix was identified.  There were no masses or implants on the peritoneum or any of the quadrants, including the liver.  The appendix was carefully dissected.  The mesoappendix was divided using  the LigaSure at its base.  The base of the appendix was dissected out and divided with a standard load Endo GIA, including a cuff of cecum for margins.  The appendix was placed in an Endocatch bag and removed via the umbilical incision.  The appendix was sent to pathology for frozen evaluation, which did not show any suspicion for mass or cancer.  The dilated portion of the appendix was due to a large appendicolith.  The staple line was then evaluated and cautery was used for minimal oozing from staple line.  The right lower quadrant was then inspected again revealing an intact staple line, no bleeding, and no bowel injury.  The 5 mm ports were removed under direct visualization and the Hasson trocar was removed.  The Endocatch bag was brought out through the umbilical incision.  The fascial opening was closed using 0 vicryl sutures x 3.  Local anesthetic was infused in all incisions and the incisions were closed with 3-0 vicryl and 4-0 Monocryl for the umbilical incision and 4-0 Monocryl for the other incisions.  The wounds were cleaned and sealed with DermaBond.  Foley catheter was removed and the patient was emerged from anesthesia and extubated and brought to the recovery room for further management.  The patient tolerated the procedure well and all counts were correct at the end of the case.   [name], MD

## 2018-04-05 NOTE — Anesthesia Post-op Follow-up Note (Signed)
Anesthesia QCDR form completed.        

## 2018-04-05 NOTE — Anesthesia Preprocedure Evaluation (Signed)
Anesthesia Evaluation  Patient identified by MRN, date of birth, ID band Patient awake    Reviewed: Allergy & Precautions, NPO status , Patient's Chart, lab work & pertinent test results  History of Anesthesia Complications Negative for: history of anesthetic complications  Airway Mallampati: I  TM Distance: >3 FB Neck ROM: Full    Dental  (+) Poor Dentition   Pulmonary sleep apnea and Continuous Positive Airway Pressure Ventilation , neg COPD,    breath sounds clear to auscultation- rhonchi (-) wheezing      Cardiovascular Exercise Tolerance: Good hypertension, Pt. on medications (-) CAD, (-) Past MI, (-) Cardiac Stents and (-) CABG  Rhythm:Regular Rate:Normal - Systolic murmurs and - Diastolic murmurs    Neuro/Psych neg Seizures negative neurological ROS  negative psych ROS   GI/Hepatic negative GI ROS, Neg liver ROS,   Endo/Other  negative endocrine ROSneg diabetes  Renal/GU negative Renal ROS     Musculoskeletal negative musculoskeletal ROS (+)   Abdominal (+) - obese,   Peds  Hematology negative hematology ROS (+)   Anesthesia Other Findings Past Medical History: No date: Hypertension No date: Sleep apnea No date: Stye external     Comment:  LEFT EYE/  RESOLVING   Reproductive/Obstetrics                             Anesthesia Physical Anesthesia Plan  ASA: II  Anesthesia Plan: General   Post-op Pain Management:    Induction: Intravenous  PONV Risk Score and Plan: 1 and Ondansetron and Midazolam  Airway Management Planned: Oral ETT  Additional Equipment:   Intra-op Plan:   Post-operative Plan: Extubation in OR  Informed Consent: I have reviewed the patients History and Physical, chart, labs and discussed the procedure including the risks, benefits and alternatives for the proposed anesthesia with the patient or authorized representative who has indicated his/her  understanding and acceptance.     Dental advisory given  Plan Discussed with: CRNA and Anesthesiologist  Anesthesia Plan Comments:         Anesthesia Quick Evaluation

## 2018-04-05 NOTE — OR Nursing (Signed)
Dr Aleen Campi and anesthesia aware patient has stye on left eye with irritation around right eye as well.

## 2018-04-05 NOTE — Discharge Instructions (Signed)

## 2018-04-06 ENCOUNTER — Encounter: Payer: Self-pay | Admitting: Surgery

## 2018-04-06 LAB — 5 HIAA, QUANTITATIVE, URINE, 24 HOUR
5 HIAA UR: 2.4 mg/L
5-HIAA,Quant.,24 Hr Urine: 3.8 mg/24 hr (ref 0.0–14.9)
Total Volume: 1600

## 2018-04-06 NOTE — Anesthesia Postprocedure Evaluation (Signed)
Anesthesia Post Note  Patient: Steven Barrett  Procedure(s) Performed: APPENDECTOMY LAPAROSCOPIC (N/A )  Patient location during evaluation: PACU Anesthesia Type: General Level of consciousness: awake and alert Pain management: pain level controlled Vital Signs Assessment: post-procedure vital signs reviewed and stable Respiratory status: spontaneous breathing, nonlabored ventilation, respiratory function stable and patient connected to nasal cannula oxygen Cardiovascular status: blood pressure returned to baseline and stable Postop Assessment: no apparent nausea or vomiting Anesthetic complications: no     Last Vitals:  Vitals:   04/05/18 1903 04/05/18 1915  BP: (!) 138/97 131/85  Pulse: (!) 54 (!) 55  Resp: 17 12  Temp:  36.4 C  SpO2: 95% 99%    Last Pain:  Vitals:   04/05/18 1915  TempSrc:   PainSc: 4                  Lenard Simmer

## 2018-04-07 LAB — SURGICAL PATHOLOGY

## 2018-04-10 NOTE — Addendum Note (Signed)
Addendum  created 04/10/18 0225 by Stormy Fabian, CRNA   Charge Capture section accepted

## 2018-04-15 DIAGNOSIS — G4733 Obstructive sleep apnea (adult) (pediatric): Secondary | ICD-10-CM | POA: Diagnosis not present

## 2018-04-18 ENCOUNTER — Ambulatory Visit (INDEPENDENT_AMBULATORY_CARE_PROVIDER_SITE_OTHER): Payer: BLUE CROSS/BLUE SHIELD | Admitting: Surgery

## 2018-04-18 ENCOUNTER — Encounter: Payer: Self-pay | Admitting: Surgery

## 2018-04-18 ENCOUNTER — Other Ambulatory Visit: Payer: Self-pay

## 2018-04-18 VITALS — BP 143/95 | HR 62 | Temp 97.3°F | Resp 18 | Ht 71.0 in | Wt 211.0 lb

## 2018-04-18 DIAGNOSIS — K389 Disease of appendix, unspecified: Secondary | ICD-10-CM

## 2018-04-18 DIAGNOSIS — Z09 Encounter for follow-up examination after completed treatment for conditions other than malignant neoplasm: Secondary | ICD-10-CM

## 2018-04-18 NOTE — Telephone Encounter (Signed)
FMLA paperwork faxed to [employer] at this time. [phone]. Copy placed in scan bin.

## 2018-04-18 NOTE — Progress Notes (Signed)
04/18/2018  HPI: Steven Barrett is a 48 y.o. male s/p laparoscopic appendectomy on 04/05/18.  He presents today for follow up.  His pathology resulted in benign findings with a 2.5 cm appendicolith and reactive changes, but no dysplasia or malignancy.  Patient reports he's feeling well.  He initially had multiple loose bowel movements per day but then has normalized to his baseline of twice a day.  He denies any significant pain or issues with the incisions.  He used lavender lotion around the incisions to help with ecchymosis and some itching.  Vital signs: BP (!) 143/95   Pulse 62   Temp (!) 97.3 F (36.3 C) (Oral)   Resp 18   Ht 5\' 11"  (1.803 m)   Wt 211 lb (95.7 kg)   SpO2 99%   BMI 29.43 kg/m    Physical Exam: Constitutional: No acute distress Abdomen:  Soft, non-distended, non-tender to palpation.  Incisions are healing well, clean, dry, intact, with no evidence of infection.  Assessment/Plan: This is a 48 y.o. male s/p laparoscopic appendectomy.  --Pathology reviewed with patient again. --Reminded of activity restrictions.  He has FMLA paperwork that we're completing.  He will have a return to work and full activities on 05/14/18, which would be almost 6 weeks since surgery. --He is going on a cruise with his wife in 1 week.  Reminded to wear sunscreen. --He may follow up with [name] as needed.   [name], MD [facility]

## 2018-04-18 NOTE — Patient Instructions (Signed)
You may resume normal activities on 05/14/2018.  Please call our office if you have any questions or concerns.

## 2018-05-09 ENCOUNTER — Encounter: Payer: Self-pay | Admitting: Surgery

## 2018-05-10 ENCOUNTER — Encounter: Payer: Self-pay | Admitting: *Deleted

## 2018-05-11 ENCOUNTER — Encounter: Payer: Self-pay | Admitting: *Deleted

## 2018-05-16 DIAGNOSIS — G4733 Obstructive sleep apnea (adult) (pediatric): Secondary | ICD-10-CM | POA: Diagnosis not present

## 2018-05-26 ENCOUNTER — Telehealth: Payer: Self-pay | Admitting: Gastroenterology

## 2018-05-26 NOTE — Telephone Encounter (Signed)
Apt has been cancelled

## 2018-05-26 NOTE — Telephone Encounter (Signed)
-----   Message from Varnita B Tahiliani, MD sent at 05/26/2018  2:02 PM EST ----- Tati, this patient has requested his next clinic appointment with us to be canceled as he would like to use his day off for his colonoscopy instead.  Please go ahead and cancel his clinic appt at his request.    Debbie when you have time next week, you can go ahead and call him to set up a screening colonoscopy, earliest in May 2020.  Thank you  

## 2018-05-30 ENCOUNTER — Telehealth: Payer: Self-pay

## 2018-05-30 NOTE — Telephone Encounter (Signed)
LVM for pt to call office to schedule his colonoscopy (Screening) with Dr. Karie Schwalbe for May.  Sent patient a message via mychart also.  Thanks [employer]

## 2018-05-30 NOTE — Telephone Encounter (Signed)
-----   Message from Pasty Spillers, MD sent at 05/26/2018  2:02 PM EST ----- Steven Barrett, this patient has requested his next clinic appointment with [name] to be canceled as he would like to use his day off for his colonoscopy instead.  Please go ahead and cancel his clinic appt at his request.    Eunice Blase when you have time next week, you can go ahead and call him to set up a screening colonoscopy, earliest in May 2020.  Thank you

## 2018-05-30 NOTE — Telephone Encounter (Signed)
-----   Message from Varnita B Tahiliani, MD sent at 05/26/2018  2:02 PM EST ----- Tati, this patient has requested his next clinic appointment with us to be canceled as he would like to use his day off for his colonoscopy instead.  Please go ahead and cancel his clinic appt at his request.    Debbie when you have time next week, you can go ahead and call him to set up a screening colonoscopy, earliest in May 2020.  Thank you  

## 2018-06-02 NOTE — Telephone Encounter (Addendum)
-----   Message from Pasty Spillers, MD sent at 05/26/2018  2:02 PM EST ----- Tati, this patient has requested his next clinic appointment with [name] to be canceled as he would like to use his day off for his colonoscopy instead.  Please go ahead and cancel his clinic appt at his request.    Eunice Blase when you have time next week, you can go ahead and call him to set up a screening colonoscopy, earliest in May 2020.  Thank you  Due to several attempts to contact pt, letter mailed.

## 2018-06-14 DIAGNOSIS — G4733 Obstructive sleep apnea (adult) (pediatric): Secondary | ICD-10-CM | POA: Diagnosis not present

## 2018-06-20 ENCOUNTER — Telehealth: Payer: Self-pay | Admitting: Physician Assistant

## 2018-06-20 NOTE — Telephone Encounter (Signed)
Please offer e-visit for HTN on 06/23/2018 I believe he has cuff at home.

## 2018-06-21 NOTE — Telephone Encounter (Signed)
lmtcb to schedule office visit

## 2018-06-23 ENCOUNTER — Ambulatory Visit (INDEPENDENT_AMBULATORY_CARE_PROVIDER_SITE_OTHER): Payer: BLUE CROSS/BLUE SHIELD | Admitting: Physician Assistant

## 2018-06-23 DIAGNOSIS — I10 Essential (primary) hypertension: Secondary | ICD-10-CM | POA: Diagnosis not present

## 2018-06-23 MED ORDER — LISINOPRIL 20 MG PO TABS: 20.0000 mg | ORAL_TABLET | Freq: Every day | ORAL | 1 refills | Status: DC

## 2018-06-23 NOTE — Progress Notes (Signed)
Subjective:    Patient ID: Steven Barrett, male    DOB: [birth year], 48 y.o.   MRN: [id number]  Steven Barrett is a 48 y.o. male presenting on 06/23/2018 for Hypertension and Abdominal Pain  Virtual Visit via Video Note  I connected with Steven Barrett on 06/23/18 at 10:40 AM EDT by a video enabled telemedicine application and verified that I am speaking with the correct person using two identifiers.   I discussed the limitations of evaluation and management by telemedicine and the availability of in person appointments. The patient expressed understanding and agreed to proceed.  HPI   HTN  He is currently taking Lisinopril 10 mg daily and amlodipine 10 mg daily. Measured Bps at home around 140s/90s. Denies headache, dizziness, chest pain. Has lost 7 lbs and currently weighs 204 lbs. Does not smoke or use decongestants.  Chronic Appendicitis  In the interim from last visit, patient had appendectomy for enlarged appendix and chronic appendicitis. Dr. Aleen Campi performed this surgery on 04/05/2018. He reports doing well after this. Reports his dizziness has resolved and his bowel movements have decreased from six times per day to 1 time per day.   Family History of Colon Cancer  He is due for colonoscopy, though this has been delayed in light of COVID-19.  Social History   Tobacco Use  . Smoking status: Never Smoker  . Smokeless tobacco: Current User    Types: Chew  Substance Use Topics  . Alcohol use: Yes    Alcohol/week: 0.0 standard drinks    Comment: occasionally  . Drug use: No    Review of Systems Per HPI unless specifically indicated above     Objective:    There were no vitals taken for this visit.  Wt Readings from Last 3 Encounters:  04/18/18 211 lb (95.7 kg)  03/29/18 209 lb 9.6 oz (95.1 kg)  03/28/18 208 lb (94.3 kg)    Physical Exam Constitutional:      Appearance: He is well-developed.  Pulmonary:     Effort: Pulmonary effort is normal. No  respiratory distress.  Neurological:     Mental Status: He is alert.  Psychiatric:        Mood and Affect: Mood normal.        Behavior: Behavior normal.    Results for orders placed or performed during the hospital encounter of 04/05/18  Surgical pathology  Result Value Ref Range   SURGICAL PATHOLOGY      Surgical Pathology CASE: [id number] PATIENT: Steven Barrett Surgical Pathology Report     SPECIMEN SUBMITTED: A. Appendix  CLINICAL HISTORY: None provided  PRE-OPERATIVE DIAGNOSIS: Chronic appendicitis  POST-OPERATIVE DIAGNOSIS: Same as pre op     DIAGNOSIS: A.  APPENDIX; APPENDECTOMY: - APPENDIX WITH 2.5 CM FECALITH AND SECONDARY REACTIVE CHANGES. - NEGATIVE FOR DYSPLASIA AND MALIGNANCY.  GROSS DESCRIPTION: A.  Intraoperative Consultation:     Labeled: Appendix     Received: Fresh     Specimen: Appendectomy specimen     Pathologic evaluation performed: Gross exam with frozen section diagnosis     Diagnosis: FS A1, appendix with fecalith: Negative for malignancy in one representative section.     Communicated to: Dr. Aleen Campi at 5:11 PM on 04/05/2018 by Elijah Birk, MD     Tissue submitted: A representative section of the area of dilation is submitted for frozen section as A1.  A. Labeled: Appendix Received: Fresh Size: 7.5 cm in length and  ranging from 0.7 to 2.8 cm in  diameter with attached mesoappendix up to 1.0 cm in thickness External surface: The external surface is tan-pink and smooth with multifocal areas of hemorrhage. Perforation: No site of perforation is grossly identified. Fecalith: Present Description: The proximal margin is inked blue.  There is a 3.0 cm in length area of luminal dilation (overlying serosa inked black), up to 2.8 cm in diameter.  Sectioning this area at the time of frozen section displays 1 intact fecalith.  The associated mucosa is tan and slightly edematous.  No associated perforation is identified.  A  representative section is submitted for frozen section as A1.  Sectioning the remainder of the specimen displays a pinpoint, patent, otherwise grossly unremarkable lumen.  No additional abnormalities are grossly identified.  Block summary: 1 - frozen section 2 - appendiceal resection margin (perpendicular) and tip (representative) 3 - appendiceal wall displaying area of  dilation 4 - cross-sections of grossly normal appendix   Final Diagnosis performed by Elijah Birk, MD.   Electronically signed 04/07/2018 9:38:29AM The electronic signature indicates that the named Attending Pathologist has evaluated the specimen  Technical component performed at [facility], [address], [place], [place] [address] Lab: [phone] Dir: Jolene Schimke, MD, MMM  Professional component performed at [facility], [facility], [address] Steven Barrett, [place], [place] [address] Lab: [phone] Dir: Georgiann Cocker. Oneita Kras, MD       Assessment & Plan:  1. Hypertension, unspecified type  Will increase lisinopril to 20 mg daily from 10 mg daily. Continue amlodipine 10 mg daily. Will discuss weight loss options at follow up visit in 3 months, hopefully when BP is better controlled.   - lisinopril (PRINIVIL,ZESTRIL) 20 MG tablet; Take 1 tablet (20 mg total) by mouth daily.  Dispense: 90 tablet; Refill: 1    Follow up plan: No follow-ups on file.  Osvaldo Angst, [name] [facility] 06/23/2018, 12:52 PM

## 2018-06-29 ENCOUNTER — Ambulatory Visit: Payer: BLUE CROSS/BLUE SHIELD | Admitting: Gastroenterology

## 2018-07-15 DIAGNOSIS — G4733 Obstructive sleep apnea (adult) (pediatric): Secondary | ICD-10-CM | POA: Diagnosis not present

## 2018-07-18 ENCOUNTER — Telehealth: Payer: Self-pay | Admitting: Gastroenterology

## 2018-07-18 DIAGNOSIS — M530 Cervicocranial syndrome: Secondary | ICD-10-CM | POA: Diagnosis not present

## 2018-07-18 DIAGNOSIS — M531 Cervicobrachial syndrome: Secondary | ICD-10-CM | POA: Diagnosis not present

## 2018-07-18 DIAGNOSIS — M9902 Segmental and somatic dysfunction of thoracic region: Secondary | ICD-10-CM | POA: Diagnosis not present

## 2018-07-18 DIAGNOSIS — M9901 Segmental and somatic dysfunction of cervical region: Secondary | ICD-10-CM | POA: Diagnosis not present

## 2018-07-18 NOTE — Telephone Encounter (Signed)
Pt wife is calling to get Debbie to call pt for colonoscopy please call pt to schedule this

## 2018-07-19 NOTE — Telephone Encounter (Signed)
Returned patients call to schedule his screening colonoscopy.  LVM for him to call office back.  Thanks [employer]

## 2018-08-08 DIAGNOSIS — M9902 Segmental and somatic dysfunction of thoracic region: Secondary | ICD-10-CM | POA: Diagnosis not present

## 2018-08-08 DIAGNOSIS — M530 Cervicocranial syndrome: Secondary | ICD-10-CM | POA: Diagnosis not present

## 2018-08-08 DIAGNOSIS — M531 Cervicobrachial syndrome: Secondary | ICD-10-CM | POA: Diagnosis not present

## 2018-08-08 DIAGNOSIS — M9901 Segmental and somatic dysfunction of cervical region: Secondary | ICD-10-CM | POA: Diagnosis not present

## 2018-08-24 DIAGNOSIS — M9902 Segmental and somatic dysfunction of thoracic region: Secondary | ICD-10-CM | POA: Diagnosis not present

## 2018-08-24 DIAGNOSIS — M531 Cervicobrachial syndrome: Secondary | ICD-10-CM | POA: Diagnosis not present

## 2018-08-24 DIAGNOSIS — M530 Cervicocranial syndrome: Secondary | ICD-10-CM | POA: Diagnosis not present

## 2018-08-24 DIAGNOSIS — M9901 Segmental and somatic dysfunction of cervical region: Secondary | ICD-10-CM | POA: Diagnosis not present

## 2018-09-19 DIAGNOSIS — M530 Cervicocranial syndrome: Secondary | ICD-10-CM | POA: Diagnosis not present

## 2018-09-19 DIAGNOSIS — M9901 Segmental and somatic dysfunction of cervical region: Secondary | ICD-10-CM | POA: Diagnosis not present

## 2018-09-19 DIAGNOSIS — M531 Cervicobrachial syndrome: Secondary | ICD-10-CM | POA: Diagnosis not present

## 2018-09-19 DIAGNOSIS — M9902 Segmental and somatic dysfunction of thoracic region: Secondary | ICD-10-CM | POA: Diagnosis not present

## 2018-09-25 DIAGNOSIS — Z3009 Encounter for other general counseling and advice on contraception: Secondary | ICD-10-CM | POA: Diagnosis not present

## 2018-09-26 ENCOUNTER — Telehealth: Payer: Self-pay

## 2018-09-26 NOTE — Telephone Encounter (Signed)
-----   Message from Vanetta Mulders, [place] sent at 06/19/2018  9:21 AM EDT ----- Regarding: Screening Colonoscopy [id number] Screening colonoscopy with Dr. T May/June 2020, per Dr. Darene Lamer.

## 2018-09-26 NOTE — Telephone Encounter (Signed)
LVM for pt to see if he would like to schedule his colonoscopy.  Thanks [employer]

## 2018-09-27 ENCOUNTER — Ambulatory Visit: Payer: Self-pay | Admitting: Physician Assistant

## 2018-10-05 DIAGNOSIS — Z302 Encounter for sterilization: Secondary | ICD-10-CM | POA: Diagnosis not present

## 2018-10-08 ENCOUNTER — Encounter: Payer: Self-pay | Admitting: Surgery

## 2018-10-10 ENCOUNTER — Ambulatory Visit: Payer: BLUE CROSS/BLUE SHIELD | Admitting: Surgery

## 2018-10-11 ENCOUNTER — Other Ambulatory Visit: Payer: Self-pay

## 2018-10-11 ENCOUNTER — Telehealth: Payer: Self-pay

## 2018-10-11 DIAGNOSIS — Z800 Family history of malignant neoplasm of digestive organs: Secondary | ICD-10-CM

## 2018-10-11 DIAGNOSIS — Z1211 Encounter for screening for malignant neoplasm of colon: Secondary | ICD-10-CM

## 2018-10-11 NOTE — Telephone Encounter (Signed)
Patients wife contacted office to schedule her husbands colonoscopy.  Colonoscopy has been scheduled for 11/03/18/ at [facility] with Dr. Bonna Gains.  Screening colonoscopy, family history of colon cancer.  COVID test 10/31/18.  Instructions mailed. Referral entered.  Thanks [employer]

## 2018-10-13 ENCOUNTER — Ambulatory Visit: Payer: Self-pay | Admitting: Surgery

## 2018-10-13 ENCOUNTER — Encounter: Payer: Self-pay | Admitting: Surgery

## 2018-10-17 DIAGNOSIS — M530 Cervicocranial syndrome: Secondary | ICD-10-CM | POA: Diagnosis not present

## 2018-10-17 DIAGNOSIS — M9902 Segmental and somatic dysfunction of thoracic region: Secondary | ICD-10-CM | POA: Diagnosis not present

## 2018-10-17 DIAGNOSIS — M531 Cervicobrachial syndrome: Secondary | ICD-10-CM | POA: Diagnosis not present

## 2018-10-17 DIAGNOSIS — M9901 Segmental and somatic dysfunction of cervical region: Secondary | ICD-10-CM | POA: Diagnosis not present

## 2018-10-24 ENCOUNTER — Other Ambulatory Visit: Payer: Self-pay

## 2018-10-24 ENCOUNTER — Ambulatory Visit: Payer: BC Managed Care – PPO | Admitting: Surgery

## 2018-10-24 ENCOUNTER — Encounter: Payer: Self-pay | Admitting: Surgery

## 2018-10-24 VITALS — BP 140/83 | HR 56 | Temp 98.1°F | Ht 71.0 in | Wt 211.0 lb

## 2018-10-24 DIAGNOSIS — K432 Incisional hernia without obstruction or gangrene: Secondary | ICD-10-CM | POA: Diagnosis not present

## 2018-10-24 NOTE — Progress Notes (Signed)
10/24/2018  History of Present Illness: Steven Barrett is a 48 y.o. male s/p laparoscopic appendectomy on 04/05/18 for an abnormal appendix.  He presents today because he has noticed a bulging at the supraumbilical incision.  He denies any significant pain from it but reports bulging which is worse towards the end of the day and with exertion.   He does heavy lifting at work and is active.  Denies any nausea, vomiting, abdominal pain, constipation, or diarrhea.  Past Medical History: Past Medical History:  Diagnosis Date  . Hypertension   . Sleep apnea   . Stye external    LEFT EYE/  RESOLVING     Past Surgical History: Past Surgical History:  Procedure Laterality Date  . KNEE SURGERY Right   . LAPAROSCOPIC APPENDECTOMY N/A 04/05/2018   Procedure: APPENDECTOMY LAPAROSCOPIC;  Surgeon: Henrene DodgePiscoya, Cicero Noy, MD;  Location: [facility] ORS;  Service: General;  Laterality: N/A;    Home Medications: Prior to Admission medications   Medication Sig Start Date End Date Taking? Authorizing Provider  amLODipine (NORVASC) 10 MG tablet Take 1 tablet (10 mg total) by mouth daily. 01/24/18  Yes Osvaldo AngstPollak, [name], [name]  ibuprofen (ADVIL,MOTRIN) 600 MG tablet Take 1 tablet (600 mg total) by mouth every 8 (eight) hours as needed for fever, mild pain or moderate pain. 04/05/18  Yes Parker Sawatzky, Elita QuickJose, MD  lisinopril (PRINIVIL,ZESTRIL) 20 MG tablet Take 1 tablet (20 mg total) by mouth daily. 06/23/18  Yes Trey SailorsPollak, [name], [name]    Allergies: No Known Allergies  Review of Systems: Review of Systems  Constitutional: Negative for chills and fever.  Respiratory: Negative for shortness of breath.   Cardiovascular: Negative for chest pain.  Gastrointestinal: Negative for abdominal pain, nausea and vomiting.    Physical Exam BP 140/83   Pulse (!) 56   Temp 98.1 F (36.7 C) (Temporal)   Ht 5\' 11"  (1.803 m)   Wt 211 lb (95.7 kg)   SpO2 97%   BMI 29.43 kg/m  CONSTITUTIONAL: No acute distress HEENT:  Normocephalic,  atraumatic, extraocular motion intact. RESPIRATORY:  Lungs are clear, and breath sounds are equal bilaterally. Normal respiratory effort without pathologic use of accessory muscles. CARDIOVASCULAR: Heart is regular without murmurs, gallops, or rubs. GI: The abdomen is soft, non-distended, non-tender. The patient does have a small component of diastasis recti above the umbilicus.  He also has a small supraumbilicla hernia at the port site, measuring about 2 cm.  His incisions are otherwise well healed.   NEUROLOGIC:  Motor and sensation is grossly normal.  Cranial nerves are grossly intact. PSYCH:  Alert and oriented to person, place and time. Affect is normal.  Labs/Imaging: None recently  Assessment and Plan: This is a 48 y.o. male with likely incisional hernia.  Discussed with the patient that since he is asymptomatic, there is no particular urgency about repair.  However, waiting will only allow the hernia to grow in size with time.  He is still working and does heavy lifting at work.  I discussed with him that this would be an elective surgery, that can be done as an outpatient, likely with mesh insertion given the size of the defect and surrounding diastasis.  The patient will think about timing of surgery with his job and will contact us to schedule surgery.  He is aware that if it's more than 30 days from today's visit, would need another visit for H&P update.  He is also aware that I will not be available between August  23 and September 7.  Face-to-face time spent with the patient and care providers was 25 minutes, with more than 50% of the time spent counseling, educating, and coordinating care of the patient.     Melvyn Neth, [facility]

## 2018-10-24 NOTE — Patient Instructions (Addendum)
The patient is aware to call back for any questions or new concerns. Recommend No heavy lifting over 10-15 pounds for 4 weeks after hernia surgery.  Hernia, Adult     A hernia is the bulging of an organ or tissue through a weak spot in the muscles of the abdomen (abdominal wall). Hernias develop most often near the belly button (navel) or the area where the leg meets the lower abdomen (groin). Common types of hernias include:  Incisional hernia. This type bulges through a scar from an abdominal surgery.  Umbilical hernia. This type develops near the navel.  Inguinal hernia. This type develops in the groin or scrotum.  Femoral hernia. This type develops under the groin, in the upper thigh area.  Hiatal hernia. This type occurs when part of the stomach slides above the muscle that separates the abdomen from the chest (diaphragm). What are the causes? This condition may be caused by:  Heavy lifting.  Coughing over a long period of time.  Straining to have a bowel movement. Constipation can lead to straining.  An incision made during an abdominal surgery.  A physical problem that is present at birth (congenital defect).  Being overweight or obese.  Smoking.  Excess fluid in the abdomen.  Undescended testicles in males. What are the signs or symptoms? The main symptom is a skin-colored, rounded bulge in the area of the hernia. However, a bulge may not always be present. It may grow bigger or be more visible when you cough or strain (such as when lifting something heavy). A hernia that can be pushed back into the area (is reducible) rarely causes pain. A hernia that cannot be pushed back into the area (is incarcerated) may lose its blood supply (become strangulated). A hernia that is incarcerated may cause:  Pain.  Fever.  Nausea and vomiting.  Swelling.  Constipation. How is this diagnosed? A hernia may be diagnosed based on:  Your symptoms and medical history.  A  physical exam. Your health care provider may ask you to cough or move in certain ways to see if the hernia becomes visible.  Imaging tests, such as: ? X-rays. ? Ultrasound. ? CT scan. How is this treated? A hernia that is small and painless may not need to be treated. A hernia that is large or painful may be treated with surgery. Inguinal hernias may be treated with surgery to prevent incarceration or strangulation. Strangulated hernias are always treated with surgery because a lack of blood supply to the trapped organ or tissue can cause it to die. Surgery to treat a hernia involves pushing the bulge back into place and repairing the weak area of the muscle or abdominal wall. Follow these instructions at home: Activity  Avoid straining.  Do not lift anything that is heavier than 10 lb (4.5 kg), or the limit that you are told, until your health care provider says that it is safe.  When lifting heavy objects, lift with your leg muscles, not your back muscles. Preventing constipation  Take actions to prevent constipation. Constipation leads to straining with bowel movements, which can make a hernia worse or cause a hernia repair to break down. Your health care provider may recommend that you: ? Drink enough fluid to keep your urine pale yellow. ? Eat foods that are high in fiber, such as fresh fruits and vegetables, whole grains, and beans. ? Limit foods that are high in fat and processed sugars, such as fried or sweet foods. ? Take  an over-the-counter or prescription medicine for constipation. General instructions  When coughing, try to cough gently.  You may try to push the hernia back in place by very gently pressing on it while lying down. Do not try to force the bulge back in if it will not push in easily.  If you are overweight, work with your health care provider to lose weight safely.  Do not use any products that contain nicotine or tobacco, such as cigarettes and e-cigarettes.  If you need help quitting, ask your health care provider.  If you are scheduled for hernia repair, watch your hernia for any changes in shape, size, or color. Tell your health care provider about any changes or new symptoms.  Take over-the-counter and prescription medicines only as told by your health care provider.  Keep all follow-up visits as told by your health care provider. This is important. Contact a health care provider if:  You develop new pain, swelling, or redness around your hernia.  You have signs of constipation, such as: ? Fewer bowel movements in a week than normal. ? Difficulty having a bowel movement. ? Stools that are dry, hard, or larger than normal. Get help right away if:  You have a fever.  You have abdomen pain that gets worse.  You feel nauseous or you vomit.  You cannot push the hernia back in place by very gently pressing on it while lying down. Do not try to force the bulge back in if it will not push in easily.  The hernia: ? Changes in shape, size, or color. ? Feels hard or tender. These symptoms may represent a serious problem that is an emergency. Do not wait to see if the symptoms will go away. Get medical help right away. Call your local emergency services (911 in the [place] Summary  A hernia is the bulging of an organ or tissue through a weak spot in the muscles of the abdomen (abdominal wall).  The main symptom is a skin-colored, rounded lump (bulge) in the hernia area. However, a bulge may not always be present. It may grow bigger or more visible when you cough or strain (such as when having a bowel movement).  A hernia that is small and painless may not need to be treated. A hernia that is large or painful may be treated with surgery.  Surgery to treat a hernia involves pushing the bulge back into place and repairing the weak part of the abdomen. This information is not intended to replace advice given to you by your health care provider.  Make sure you discuss any questions you have with your health care provider. Document Released: 03/08/2005 Document Revised: 06/29/2018 Document Reviewed: 12/08/2016 Elsevier Patient Education  2020 [employer]

## 2018-10-30 ENCOUNTER — Telehealth: Payer: Self-pay | Admitting: Gastroenterology

## 2018-10-30 NOTE — Telephone Encounter (Signed)
Patient's wife called & needs to know if we can change the coding  from a screening colonoscopy to a dx colonoscopy since he has diarrhea. He is scheduled for the covid test 10-31-18. Please call her ASAP. He has colonoscopy on 11-03-18.

## 2018-10-31 ENCOUNTER — Other Ambulatory Visit: Admission: RE | Admit: 2018-10-31 | Payer: BC Managed Care – PPO | Source: Ambulatory Visit

## 2018-10-31 NOTE — Telephone Encounter (Signed)
Pt wife called to cancel pt procedure for 11/03/18 due to coding of this procedure she states his insurance will not cover this

## 2018-11-03 ENCOUNTER — Encounter: Admission: RE | Payer: Self-pay | Source: Home / Self Care

## 2018-11-03 ENCOUNTER — Ambulatory Visit
Admission: RE | Admit: 2018-11-03 | Payer: BC Managed Care – PPO | Source: Home / Self Care | Admitting: Gastroenterology

## 2018-11-03 SURGERY — COLONOSCOPY WITH PROPOFOL
Anesthesia: General

## 2018-11-07 DIAGNOSIS — M531 Cervicobrachial syndrome: Secondary | ICD-10-CM | POA: Diagnosis not present

## 2018-11-07 DIAGNOSIS — M9901 Segmental and somatic dysfunction of cervical region: Secondary | ICD-10-CM | POA: Diagnosis not present

## 2018-11-07 DIAGNOSIS — M9902 Segmental and somatic dysfunction of thoracic region: Secondary | ICD-10-CM | POA: Diagnosis not present

## 2018-11-07 DIAGNOSIS — M530 Cervicocranial syndrome: Secondary | ICD-10-CM | POA: Diagnosis not present

## 2018-11-21 DIAGNOSIS — M9901 Segmental and somatic dysfunction of cervical region: Secondary | ICD-10-CM | POA: Diagnosis not present

## 2018-11-21 DIAGNOSIS — M9902 Segmental and somatic dysfunction of thoracic region: Secondary | ICD-10-CM | POA: Diagnosis not present

## 2018-11-21 DIAGNOSIS — M531 Cervicobrachial syndrome: Secondary | ICD-10-CM | POA: Diagnosis not present

## 2018-11-21 DIAGNOSIS — M530 Cervicocranial syndrome: Secondary | ICD-10-CM | POA: Diagnosis not present

## 2018-11-29 DIAGNOSIS — I10 Essential (primary) hypertension: Secondary | ICD-10-CM | POA: Diagnosis not present

## 2018-11-29 DIAGNOSIS — G4730 Sleep apnea, unspecified: Secondary | ICD-10-CM | POA: Diagnosis not present

## 2018-11-29 DIAGNOSIS — N23 Unspecified renal colic: Secondary | ICD-10-CM | POA: Diagnosis not present

## 2018-11-29 DIAGNOSIS — R197 Diarrhea, unspecified: Secondary | ICD-10-CM | POA: Diagnosis not present

## 2018-11-29 DIAGNOSIS — Z79899 Other long term (current) drug therapy: Secondary | ICD-10-CM | POA: Diagnosis not present

## 2018-11-29 DIAGNOSIS — N132 Hydronephrosis with renal and ureteral calculous obstruction: Secondary | ICD-10-CM | POA: Diagnosis not present

## 2018-11-29 DIAGNOSIS — R300 Dysuria: Secondary | ICD-10-CM | POA: Diagnosis not present

## 2018-11-29 DIAGNOSIS — R350 Frequency of micturition: Secondary | ICD-10-CM | POA: Diagnosis not present

## 2018-11-29 DIAGNOSIS — Z6828 Body mass index (BMI) 28.0-28.9, adult: Secondary | ICD-10-CM | POA: Diagnosis not present

## 2018-12-01 ENCOUNTER — Ambulatory Visit: Payer: BC Managed Care – PPO | Admitting: Family Medicine

## 2018-12-01 NOTE — Progress Notes (Deleted)
       Patient: Steven Barrett Male    DOB: 09-16-1970   48 y.o.   MRN: [id number] Visit Date: 12/01/2018  Today's Provider: Lavon Paganini, MD   No chief complaint on file.  Subjective:     HPI  Hypertension, follow-up:  BP Readings from Last 3 Encounters:  10/24/18 140/83  04/18/18 (!) 143/95  04/05/18 131/85    He was last seen for hypertension 5 months ago.  BP at that visit was 143/95 on 04/18/2018. Management changes since that visit include Lisinopril increased from 10 mg to 20 mg daily and advised to continue Amlodipine 10 mg daily. He reports good compliance with treatment. He is not having side effects.  He {is/is not:9024} exercising. He {is/is not:9024} adherent to low salt diet.   Outside blood pressures are ***. He is experiencing none.  Patient denies chest pain, chest pressure/discomfort, claudication, dyspnea, exertional chest pressure/discomfort, fatigue, irregular heart beat, lower extremity edema, near-syncope, orthopnea, palpitations, paroxysmal nocturnal dyspnea, syncope and tachypnea.   Cardiovascular risk factors include hypertension and male gender.  Use of agents associated with hypertension: none.     Weight trend: stable Wt Readings from Last 3 Encounters:  10/24/18 211 lb (95.7 kg)  04/18/18 211 lb (95.7 kg)  03/29/18 209 lb 9.6 oz (95.1 kg)   ------------------------------------------------------------------------  No Known Allergies   Current Outpatient Medications:  .  amLODipine (NORVASC) 10 MG tablet, Take 1 tablet (10 mg total) by mouth daily., Disp: 90 tablet, Rfl: 3 .  ibuprofen (ADVIL,MOTRIN) 600 MG tablet, Take 1 tablet (600 mg total) by mouth every 8 (eight) hours as needed for fever, mild pain or moderate pain., Disp: 30 tablet, Rfl: 0 .  lisinopril (PRINIVIL,ZESTRIL) 20 MG tablet, Take 1 tablet (20 mg total) by mouth daily., Disp: 90 tablet, Rfl: 1  Review of Systems  Constitutional: Negative.   Respiratory: Negative.    Cardiovascular: Negative.   Genitourinary: Negative.   Musculoskeletal: Negative.     Social History   Tobacco Use  . Smoking status: Never Smoker  . Smokeless tobacco: Current User    Types: Chew  Substance Use Topics  . Alcohol use: Yes    Alcohol/week: 0.0 standard drinks    Comment: occasionally      Objective:   There were no vitals taken for this visit. There were no vitals filed for this visit.There is no height or weight on file to calculate BMI.   Physical Exam   No results found for any visits on 12/01/18.     Assessment & Plan        Lavon Paganini, MD  [facility]

## 2018-12-04 DIAGNOSIS — N201 Calculus of ureter: Secondary | ICD-10-CM | POA: Diagnosis not present

## 2018-12-04 DIAGNOSIS — N200 Calculus of kidney: Secondary | ICD-10-CM | POA: Diagnosis not present

## 2018-12-18 DIAGNOSIS — N200 Calculus of kidney: Secondary | ICD-10-CM | POA: Diagnosis not present

## 2018-12-19 DIAGNOSIS — M530 Cervicocranial syndrome: Secondary | ICD-10-CM | POA: Diagnosis not present

## 2018-12-19 DIAGNOSIS — M9902 Segmental and somatic dysfunction of thoracic region: Secondary | ICD-10-CM | POA: Diagnosis not present

## 2018-12-19 DIAGNOSIS — M531 Cervicobrachial syndrome: Secondary | ICD-10-CM | POA: Diagnosis not present

## 2018-12-19 DIAGNOSIS — M9901 Segmental and somatic dysfunction of cervical region: Secondary | ICD-10-CM | POA: Diagnosis not present

## 2019-01-03 ENCOUNTER — Encounter: Payer: Self-pay | Admitting: Physician Assistant

## 2019-01-03 ENCOUNTER — Ambulatory Visit: Payer: BC Managed Care – PPO | Admitting: Physician Assistant

## 2019-01-03 ENCOUNTER — Other Ambulatory Visit: Payer: Self-pay

## 2019-01-03 VITALS — BP 133/81 | HR 66 | Temp 97.5°F | Resp 16 | Ht 71.0 in | Wt 209.0 lb

## 2019-01-03 DIAGNOSIS — I10 Essential (primary) hypertension: Secondary | ICD-10-CM

## 2019-01-03 DIAGNOSIS — F419 Anxiety disorder, unspecified: Secondary | ICD-10-CM

## 2019-01-03 DIAGNOSIS — Z1211 Encounter for screening for malignant neoplasm of colon: Secondary | ICD-10-CM

## 2019-01-03 DIAGNOSIS — Z9189 Other specified personal risk factors, not elsewhere classified: Secondary | ICD-10-CM

## 2019-01-03 DIAGNOSIS — Z800 Family history of malignant neoplasm of digestive organs: Secondary | ICD-10-CM

## 2019-01-03 MED ORDER — SERTRALINE HCL 50 MG PO TABS: 50.0000 mg | ORAL_TABLET | Freq: Every day | ORAL | 0 refills | Status: DC

## 2019-01-03 NOTE — Patient Instructions (Signed)

## 2019-01-03 NOTE — Progress Notes (Signed)
Patient: NICOLAAS SAVO Male    DOB: 30-Sep-1970   48 y.o.   MRN: [id number] Visit Date: 01/03/2019  Today's Provider: Trinna Post, [name]   Chief Complaint  Patient presents with  . Anxiety   Subjective:   Presenting today for worsening anxiety. Reports good relationships with his family. Worries about his family and leaving his wife alone to raise his son. Has some concerns regarding his age and his young son. Denies anxiety as a child. Reports he has an open relationship with his wife and feels comfortable talking about his feelings. 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 Reports increased irritability.   Anxiety Presents for initial visit. Onset was 1 to 5 years ago (1 year). Symptoms include decreased concentration, dizziness, excessive worry, irritability, malaise, muscle tension and nervous/anxious behavior. Patient reports no chest pain, compulsions, confusion, depressed mood, dry mouth, feeling of choking, hyperventilation, impotence, insomnia, nausea, obsessions, palpitations or shortness of breath.     Reports he was not able to get colonoscopy due to insurance not covering procedure as it was not listed under right code. He is unsure of which code is necessary. He has a family history of colon cancer in first degree relative.   BP Readings from Last 3 Encounters:  10/24/18 140/83  04/18/18 (!) 143/95  04/05/18 131/85   Wt Readings from Last 3 Encounters:  10/24/18 211 lb (95.7 kg)  04/18/18 211 lb (95.7 kg)  03/29/18 209 lb 9.6 oz (95.1 kg)     No Known Allergies   Current Outpatient Medications:  .  amLODipine (NORVASC) 10 MG tablet, Take 1 tablet (10 mg total) by mouth daily., Disp: 90 tablet, Rfl: 3 .  ibuprofen (ADVIL,MOTRIN) 600 MG tablet, Take 1 tablet (600 mg total) by mouth every 8 (eight) hours as needed for fever, mild pain or moderate pain., Disp: 30 tablet, Rfl: 0 .  lisinopril  (PRINIVIL,ZESTRIL) 20 MG tablet, Take 1 tablet (20 mg total) by mouth daily., Disp: 90 tablet, Rfl: 1  Review of Systems  Constitutional: Positive for irritability. Negative for appetite change, chills and fever.  Respiratory: Negative for chest tightness, shortness of breath and wheezing.   Cardiovascular: Negative for chest pain and palpitations.  Gastrointestinal: Negative for abdominal pain, nausea and vomiting.  Genitourinary: Negative for impotence.  Neurological: Positive for dizziness.  Psychiatric/Behavioral: Positive for decreased concentration. Negative for confusion. The patient is nervous/anxious. The patient does not have insomnia.     Social History   Tobacco Use  . Smoking status: Never Smoker  . Smokeless tobacco: Current User    Types: Chew  Substance Use Topics  . Alcohol use: Yes    Alcohol/week: 0.0 standard drinks    Comment: occasionally      Objective:   There were no vitals taken for this visit. There were no vitals filed for this visit.There is no height or weight on file to calculate BMI.   Physical Exam Constitutional:      Appearance: Normal appearance.  Cardiovascular:     Rate and Rhythm: Normal rate and regular rhythm.     Heart sounds: Normal heart sounds.  Pulmonary:     Effort: Pulmonary effort is normal.     Breath sounds: Normal breath sounds.  Skin:    General: Skin is warm and dry.  Neurological:     Mental Status: He is alert and oriented to person, place, and time. Mental status is at  baseline.  Psychiatric:        Mood and Affect: Mood normal.        Behavior: Behavior normal.      No results found for any visits on 01/03/19.     Assessment & Plan    1. Anxiety  Explained risks and benefits of SSRIs, time until effect can be felt and side effects. Will f/u 4-6 weeks virtually.   - sertraline (ZOLOFT) 50 MG tablet; Take 1 tablet (50 mg total) by mouth daily.  Dispense: 90 tablet; Refill: 0  2. Hypertension,  unspecified type  Stable, continue medications. BP well controlled on medications and there is no indication to discontinue today.   3. Colon cancer screening  - Ambulatory referral to Gastroenterology  4. Family history of colon cancer  - Ambulatory referral to Gastroenterology  5. High risk for colon cancer  - Ambulatory referral to Gastroenterology  The entirety of the information documented in the History of Present Illness, Review of Systems and Physical Exam were personally obtained by me. Portions of this information were initially documented by April M. Hyacinth Meeker, CMA and reviewed by me for thoroughness and accuracy.       Trey Sailors, [name]  [facility]

## 2019-01-09 DIAGNOSIS — M9902 Segmental and somatic dysfunction of thoracic region: Secondary | ICD-10-CM | POA: Diagnosis not present

## 2019-01-09 DIAGNOSIS — M530 Cervicocranial syndrome: Secondary | ICD-10-CM | POA: Diagnosis not present

## 2019-01-09 DIAGNOSIS — M531 Cervicobrachial syndrome: Secondary | ICD-10-CM | POA: Diagnosis not present

## 2019-01-09 DIAGNOSIS — M9901 Segmental and somatic dysfunction of cervical region: Secondary | ICD-10-CM | POA: Diagnosis not present

## 2019-01-10 ENCOUNTER — Telehealth: Payer: Self-pay | Admitting: Physician Assistant

## 2019-01-10 DIAGNOSIS — F419 Anxiety disorder, unspecified: Secondary | ICD-10-CM

## 2019-01-10 MED ORDER — HYDROXYZINE HCL 10 MG PO TABS: 10.0000 mg | ORAL_TABLET | Freq: Two times a day (BID) | ORAL | 0 refills | Status: DC | PRN

## 2019-01-10 NOTE — Telephone Encounter (Signed)
Patient advised as below. Patient verbalizes understanding and is in agreement with treatment plan. Please send to [facility] in [place].

## 2019-01-10 NOTE — Addendum Note (Signed)
Addended by: Trinna Post on: 01/10/2019 02:27 PM   Modules accepted: Orders

## 2019-01-10 NOTE — Telephone Encounter (Signed)
Patient reports anxiety is not improving since staring medication x's one week. Patient wants to know if he can increase dose or change medication. sd

## 2019-01-10 NOTE — Telephone Encounter (Signed)
I wouldn't recommend it. It is not enough time to work. I can send him in some hydroxyzine which is PRN anxiety medication if he would like. However, this does sedate people and he can't take it while driving.

## 2019-01-10 NOTE — Telephone Encounter (Signed)
Pt needing to ask some questions regarding the sertraline (ZOLOFT) 50 MG tablet he is taking.  Please call pt back at [phone].  Thanks, [employer]

## 2019-01-10 NOTE — Telephone Encounter (Signed)
I have sent hydroxyzine 10 mg BID PRN #30 to [facility]. See him in follow up.

## 2019-01-11 ENCOUNTER — Telehealth: Payer: Self-pay | Admitting: Physician Assistant

## 2019-01-11 NOTE — Telephone Encounter (Signed)
Patients wife Caryl Pina advised as below. She states his anxiety worsens when he drives, so she  wants to know if there is something that he can take for anxiety like Ativan that would help, or if the dose of his Zoloft could be increased? He currently takes 50mg  of Zoloft daily.

## 2019-01-11 NOTE — Telephone Encounter (Signed)
No Ativan poses a bigger sedation risk than hydroxyzine and is not safe to take while driving. As we discussed yesterday, it is too early to increase zoloft. It takes 2-4 weeks to work. We can connect him with our social worker for counseling sessions which may be helpful to him.

## 2019-01-11 NOTE — Telephone Encounter (Signed)
Pt's wife called saying the anxiety medication Hydroxyzine 10mg  Fabio Bering gave him is causing him to be very sleepy.  He almost fell asleep on the way home from work last night.  CB#  [phone]  [place]
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
## 2019-01-11 NOTE — Telephone Encounter (Signed)
Yes as discussed when I sent it in yesterday this medication is sedating and he should not take it while driving. There is no short acting medication for anxiety that does not carry risk of sedation.

## 2019-01-12 DIAGNOSIS — H00022 Hordeolum internum right lower eyelid: Secondary | ICD-10-CM | POA: Diagnosis not present

## 2019-01-12 DIAGNOSIS — H0014 Chalazion left upper eyelid: Secondary | ICD-10-CM | POA: Diagnosis not present

## 2019-01-12 DIAGNOSIS — H00021 Hordeolum internum right upper eyelid: Secondary | ICD-10-CM | POA: Diagnosis not present

## 2019-01-22 DIAGNOSIS — M531 Cervicobrachial syndrome: Secondary | ICD-10-CM | POA: Diagnosis not present

## 2019-01-22 DIAGNOSIS — M9901 Segmental and somatic dysfunction of cervical region: Secondary | ICD-10-CM | POA: Diagnosis not present

## 2019-01-22 DIAGNOSIS — M9902 Segmental and somatic dysfunction of thoracic region: Secondary | ICD-10-CM | POA: Diagnosis not present

## 2019-01-22 DIAGNOSIS — M530 Cervicocranial syndrome: Secondary | ICD-10-CM | POA: Diagnosis not present

## 2019-01-23 ENCOUNTER — Encounter: Payer: Self-pay | Admitting: *Deleted

## 2019-01-26 DIAGNOSIS — M47814 Spondylosis without myelopathy or radiculopathy, thoracic region: Secondary | ICD-10-CM | POA: Diagnosis not present

## 2019-01-26 DIAGNOSIS — M47812 Spondylosis without myelopathy or radiculopathy, cervical region: Secondary | ICD-10-CM | POA: Diagnosis not present

## 2019-01-30 ENCOUNTER — Other Ambulatory Visit: Payer: Self-pay | Admitting: Physician Assistant

## 2019-01-30 DIAGNOSIS — I10 Essential (primary) hypertension: Secondary | ICD-10-CM

## 2019-01-30 MED ORDER — AMLODIPINE BESYLATE 10 MG PO TABS: 10.0000 mg | ORAL_TABLET | Freq: Every day | ORAL | 3 refills | Status: DC

## 2019-01-30 NOTE — Telephone Encounter (Signed)
L.O.V. was on 01/03/2019. 

## 2019-01-30 NOTE — Telephone Encounter (Signed)
Pt needing refills on:  amLODipine (NORVASC) 10 MG tablet   Please fill at: [facility], [address] AT [place] [phone] (Phone) [phone] (Fax)   Thanks, [employer]

## 2019-02-07 ENCOUNTER — Telehealth (INDEPENDENT_AMBULATORY_CARE_PROVIDER_SITE_OTHER): Payer: BC Managed Care – PPO | Admitting: Physician Assistant

## 2019-02-07 DIAGNOSIS — R197 Diarrhea, unspecified: Secondary | ICD-10-CM | POA: Diagnosis not present

## 2019-02-07 DIAGNOSIS — F419 Anxiety disorder, unspecified: Secondary | ICD-10-CM | POA: Diagnosis not present

## 2019-02-07 DIAGNOSIS — G4730 Sleep apnea, unspecified: Secondary | ICD-10-CM

## 2019-02-07 DIAGNOSIS — F32A Depression, unspecified: Secondary | ICD-10-CM

## 2019-02-07 DIAGNOSIS — Z800 Family history of malignant neoplasm of digestive organs: Secondary | ICD-10-CM

## 2019-02-07 DIAGNOSIS — F329 Major depressive disorder, single episode, unspecified: Secondary | ICD-10-CM

## 2019-02-07 NOTE — Patient Instructions (Signed)

## 2019-02-07 NOTE — Progress Notes (Signed)
Patient: Steven Barrett Male    DOB: [birth year]   48 y.o.   MRN: [id number] Visit Date: 02/07/2019  Today's Provider: Trinna Post, [name]   Chief Complaint  Patient presents with  . Anxiety   Subjective:     Virtual Visit via Video Note  I connected with Steven Barrett on 02/07/19 at  1:20 PM EST by a video enabled telemedicine application and verified that I am speaking with the correct person using two identifiers.  Location: Patient: Home Provider: Office   I discussed the limitations of evaluation and management by telemedicine and the availability of in person appointments. The patient expressed understanding and agreed to proceed.  HPI    Anxiety Patient presents today for anxiety follow-up. Patient last office visit was on 01/03/2019. Patient was started on Zoloft 50 MG daily. He reports he is having a better time with his emotions. He is able to leave for work easier and is less emotional. He feels more comfortable going to work.   Dr. Hampton Abbot upcoming appointment on 10/62/6948 for umbilical hernia.   His prior colonoscopy was cancelled as the screening colonoscopy codes were not covered by his insurance. He was told it must be a diagnostic code to be covered before the age of 28.   He also reports some issues with his CPAP machine. He reports being able to sleep with it for about 2 hours and then waking up and taking it off due to discomfort and not feeling like it is working properly. This has been happening for several months now.     No Known Allergies   Current Outpatient Medications:  .  amLODipine (NORVASC) 10 MG tablet, Take 1 tablet (10 mg total) by mouth daily., Disp: 90 tablet, Rfl: 3 .  hydrOXYzine (ATARAX/VISTARIL) 10 MG tablet, Take 1 tablet (10 mg total) by mouth 2 (two) times daily as needed., Disp: 30 tablet, Rfl: 0 .  ibuprofen (ADVIL,MOTRIN) 600 MG tablet, Take 1 tablet (600 mg total) by mouth every 8 (eight) hours as needed for  fever, mild pain or moderate pain., Disp: 30 tablet, Rfl: 0 .  lisinopril (PRINIVIL,ZESTRIL) 20 MG tablet, Take 1 tablet (20 mg total) by mouth daily., Disp: 90 tablet, Rfl: 1 .  sertraline (ZOLOFT) 50 MG tablet, Take 1 tablet (50 mg total) by mouth daily., Disp: 90 tablet, Rfl: 0  Review of Systems  Constitutional: Negative.   Respiratory: Negative.   Neurological: Negative.   Psychiatric/Behavioral: Negative.     Social History   Tobacco Use  . Smoking status: Never Smoker  . Smokeless tobacco: Current User    Types: Chew  Substance Use Topics  . Alcohol use: Yes    Alcohol/week: 0.0 standard drinks    Comment: occasionally      Objective:   There were no vitals taken for this visit. There were no vitals filed for this visit.There is no height or weight on file to calculate BMI.   Physical Exam Constitutional:      Appearance: Normal appearance.  Pulmonary:     Effort: Pulmonary effort is normal. No respiratory distress.  Neurological:     Mental Status: He is alert and oriented to person, place, and time. Mental status is at baseline.  Psychiatric:        Mood and Affect: Mood normal.        Behavior: Behavior normal.      No results found for any visits on 02/07/19.  Assessment & Plan    1. Anxiety and depression  Doing better on 50 mg zoloft. Continue. Counseled we do have therapist in office.  2. Sleep apnea, unspecified type  Suggest calling sleep company to see if machine needs to be investigated or if he needs different mask. If those options do not bring about relief, consider split night slip study for reassessment.  3. FH: colon cancer   4. Diarrhea, unspecified type  Colonoscopy will have to be diagnostic for insurance to cover. Advised to relay this message to GI specialist.   I discussed the assessment and treatment plan with the patient. The patient was provided an opportunity to ask questions and all were answered. The patient agreed  with the plan and demonstrated an understanding of the instructions.   The patient was advised to call back or seek an in-person evaluation if the symptoms worsen or if the condition fails to improve as anticipated.  I provided 15 minutes of non-face-to-face time during this encounter.  The entirety of the information documented in the History of Present Illness, Review of Systems and Physical Exam were personally obtained by me. Portions of this information were initially documented by [facility], CMA and reviewed by me for thoroughness and accuracy.         Trey Sailors, [name]  [facility]

## 2019-02-13 ENCOUNTER — Other Ambulatory Visit: Payer: Self-pay

## 2019-02-13 ENCOUNTER — Ambulatory Visit (INDEPENDENT_AMBULATORY_CARE_PROVIDER_SITE_OTHER): Payer: BC Managed Care – PPO | Admitting: Surgery

## 2019-02-13 ENCOUNTER — Telehealth: Payer: Self-pay | Admitting: Gastroenterology

## 2019-02-13 ENCOUNTER — Encounter: Payer: Self-pay | Admitting: Surgery

## 2019-02-13 DIAGNOSIS — K432 Incisional hernia without obstruction or gangrene: Secondary | ICD-10-CM

## 2019-02-13 NOTE — Patient Instructions (Signed)
Our surgery scheduler will call to schedule your surgery within 24/48 hours. Please have the Blue sheet available when speaking with her.

## 2019-02-13 NOTE — H&P (View-Only) (Signed)
  02/13/2019  History of Present Illness: Steven Barrett is a 48 y.o. male s/p laparoscopic appendectomy on 04/05/18 for an abnormal appendix noted on CT scan.  He developed subsequently an incisional hernia at the umbilical port site.  He was last seen on 10/24/18 and it was about 2 cm in size.  He feels that it has not grown in size, but he feels it's now time to fix it, as he keeps having intermittent issues with pain/discomfort, particularly when he's doing heavy lifting or pushing at work.  He denies any unbearable pain, nausea, vomiting, constipation, or diarrhea.  Past Medical History: Past Medical History:  Diagnosis Date  . Hypertension   . Sleep apnea   . Stye external    LEFT EYE/  RESOLVING     Past Surgical History: Past Surgical History:  Procedure Laterality Date  . KNEE SURGERY Right   . LAPAROSCOPIC APPENDECTOMY N/A 04/05/2018   Procedure: APPENDECTOMY LAPAROSCOPIC;  Surgeon: Olean Ree, MD;  Location: [facility] ORS;  Service: General;  Laterality: N/A;    Home Medications: Prior to Admission medications   Medication Sig Start Date End Date Taking? Authorizing Provider  amLODipine (NORVASC) 10 MG tablet Take 1 tablet (10 mg total) by mouth daily. 01/30/19  Yes Trinna Post, [name]  hydrOXYzine (ATARAX/VISTARIL) 10 MG tablet Take 1 tablet (10 mg total) by mouth 2 (two) times daily as needed. 01/10/19  Yes Carles Collet M, [name]  lisinopril (PRINIVIL,ZESTRIL) 20 MG tablet Take 1 tablet (20 mg total) by mouth daily. 06/23/18  Yes Trinna Post, [name]  sertraline (ZOLOFT) 50 MG tablet Take 1 tablet (50 mg total) by mouth daily. 01/03/19  Yes Trinna Post, [name]    Allergies: No Known Allergies  Review of Systems: Review of Systems  Constitutional: Negative for chills and fever.  Respiratory: Negative for shortness of breath.   Cardiovascular: Negative for chest pain.  Gastrointestinal: Negative for abdominal pain, constipation, diarrhea, nausea and vomiting.   Skin: Negative for rash.    Physical Exam There were no vitals taken for this visit. CONSTITUTIONAL: No acute distress HEENT:  Normocephalic, atraumatic, extraocular motion intact. RESPIRATORY:  Lungs are clear, and breath sounds are equal bilaterally. Normal respiratory effort without pathologic use of accessory muscles. CARDIOVASCULAR: Heart is regular without murmurs, gallops, or rubs. GI: The abdomen is soft, non-distended, non-tender to palpation.  Patient has a supraumbilical incisional hernia about two cm in size, reducible, with no tenderness to palpation over it. NEUROLOGIC:  Motor and sensation is grossly normal.  Cranial nerves are grossly intact. PSYCH:  Alert and oriented to person, place and time. Affect is normal.  Labs/Imaging: None recently.  Assessment and Plan: This is a 48 y.o. male with incisional hernia at the umbilicus, s/p laparoscopic appendectomy.  Discussed with the patient that we can certainly proceed with incisional umbilical hernia repair.  Discussed with him that given the size, we would use mesh to reinforce the repair.  This would be an outpatient procedure.  Discussed risks of bleeding, infection, injury to surrounding structures, and he's willing to proceed.  He also understands that he would need COVID testing prior to surgery.  We'll schedule him for 02/26/19.  Face-to-face time spent with the patient and care providers was 25 minutes, with more than 50% of the time spent counseling, educating, and coordinating care of the patient.     Melvyn Neth, [facility]

## 2019-02-13 NOTE — Progress Notes (Signed)
  02/13/2019  History of Present Illness: Steven Barrett is a 48 y.o. male s/p laparoscopic appendectomy on 04/05/18 for an abnormal appendix noted on CT scan.  He developed subsequently an incisional hernia at the umbilical port site.  He was last seen on 10/24/18 and it was about 2 cm in size.  He feels that it has not grown in size, but he feels it's now time to fix it, as he keeps having intermittent issues with pain/discomfort, particularly when he's doing heavy lifting or pushing at work.  He denies any unbearable pain, nausea, vomiting, constipation, or diarrhea.  Past Medical History: Past Medical History:  Diagnosis Date  . Hypertension   . Sleep apnea   . Stye external    LEFT EYE/  RESOLVING     Past Surgical History: Past Surgical History:  Procedure Laterality Date  . KNEE SURGERY Right   . LAPAROSCOPIC APPENDECTOMY N/A 04/05/2018   Procedure: APPENDECTOMY LAPAROSCOPIC;  Surgeon: [name], MD;  Location: [facility] ORS;  Service: General;  Laterality: N/A;    Home Medications: Prior to Admission medications   Medication Sig Start Date End Date Taking? Authorizing Provider  amLODipine (NORVASC) 10 MG tablet Take 1 tablet (10 mg total) by mouth daily. 01/30/19  Yes Pollak, [name], [name]  hydrOXYzine (ATARAX/VISTARIL) 10 MG tablet Take 1 tablet (10 mg total) by mouth 2 (two) times daily as needed. 01/10/19  Yes Pollak, [name], [name]  lisinopril (PRINIVIL,ZESTRIL) 20 MG tablet Take 1 tablet (20 mg total) by mouth daily. 06/23/18  Yes Pollak, [name], [name]  sertraline (ZOLOFT) 50 MG tablet Take 1 tablet (50 mg total) by mouth daily. 01/03/19  Yes Pollak, [name], [name]    Allergies: No Known Allergies  Review of Systems: Review of Systems  Constitutional: Negative for chills and fever.  Respiratory: Negative for shortness of breath.   Cardiovascular: Negative for chest pain.  Gastrointestinal: Negative for abdominal pain, constipation, diarrhea, nausea and vomiting.   Skin: Negative for rash.    Physical Exam There were no vitals taken for this visit. CONSTITUTIONAL: No acute distress HEENT:  Normocephalic, atraumatic, extraocular motion intact. RESPIRATORY:  Lungs are clear, and breath sounds are equal bilaterally. Normal respiratory effort without pathologic use of accessory muscles. CARDIOVASCULAR: Heart is regular without murmurs, gallops, or rubs. GI: The abdomen is soft, non-distended, non-tender to palpation.  Patient has a supraumbilical incisional hernia about two cm in size, reducible, with no tenderness to palpation over it. NEUROLOGIC:  Motor and sensation is grossly normal.  Cranial nerves are grossly intact. PSYCH:  Alert and oriented to person, place and time. Affect is normal.  Labs/Imaging: None recently.  Assessment and Plan: This is a 48 y.o. male with incisional hernia at the umbilicus, s/p laparoscopic appendectomy.  Discussed with the patient that we can certainly proceed with incisional umbilical hernia repair.  Discussed with him that given the size, we would use mesh to reinforce the repair.  This would be an outpatient procedure.  Discussed risks of bleeding, infection, injury to surrounding structures, and he's willing to proceed.  He also understands that he would need COVID testing prior to surgery.  We'll schedule him for 02/26/19.  Face-to-face time spent with the patient and care providers was 25 minutes, with more than 50% of the time spent counseling, educating, and coordinating care of the patient.     [name], MD [facility]    

## 2019-02-13 NOTE — Telephone Encounter (Signed)
Pt called to schedule apt from Referral

## 2019-02-19 ENCOUNTER — Telehealth: Payer: Self-pay | Admitting: Surgery

## 2019-02-19 NOTE — Telephone Encounter (Signed)
Pt has been advised of pre admission date/time, Covid Testing date and Surgery date.  Surgery Date: 02/26/19 with Dr Yong Channel hernia repair with mesh.  Preadmission Testing Date: 02/20/19 between 8-1:00pm-phone interview.  Covid Testing Date: 02/22/19 between 8-10:30am - patient advised to go to the [facility] ([place])  [employer] Video sent via [employer] [facility] and [employer].  Patient has been made aware to call [phone], between 1-3:00pm the day before surgery, to find out what time to arrive.

## 2019-02-19 NOTE — Telephone Encounter (Signed)
LVM for pt to call office back to schedule colonoscopy.  Thanks Paxton Kanaan

## 2019-02-20 ENCOUNTER — Encounter
Admission: RE | Admit: 2019-02-20 | Discharge: 2019-02-20 | Disposition: A | Discharge status: Home / Self Care | Payer: BC Managed Care – PPO | Source: Ambulatory Visit | Attending: Surgery | Admitting: Surgery

## 2019-02-20 ENCOUNTER — Other Ambulatory Visit: Payer: Self-pay

## 2019-02-20 HISTORY — DX: Anxiety disorder, unspecified: F41.9

## 2019-02-20 NOTE — Patient Instructions (Signed)
Your procedure is scheduled on: 02/26/2019 Mon Report to Same Day Surgery 2nd floor medical mall [facility] Entrance-take elevator on left to 2nd floor.  Check in with surgery information desk.) To find out your arrival time please call [phone] between 1PM - 3PM on 02/23/2019 Fri  Remember: Instructions that are not followed completely may result in serious medical risk, up to and including death, or upon the discretion of your surgeon and anesthesiologist your surgery may need to be rescheduled.    _x___ 1. Do not eat food after midnight the night before your procedure. You may drink clear liquids up to 2 hours before you are scheduled to arrive at the hospital for your procedure.  Do not drink clear liquids within 2 hours of your scheduled arrival to the hospital.  Clear liquids include  --Water or Apple juice without pulp  --Clear carbohydrate beverage such as ClearFast or Gatorade  --Black Coffee or Clear Tea (No milk, no creamers, do not add anything to                  the coffee or Tea Type 1 and type 2 diabetics should only drink water.   ____Ensure clear carbohydrate drink on the way to the hospital for bariatric patients  ____Ensure clear carbohydrate drink 3 hours before surgery.   No gum chewing or hard candies.     __x__ 2. No Alcohol for 24 hours before or after surgery.   __x__3. No Smoking or e-cigarettes for 24 prior to surgery.  Do not use any chewable tobacco products for at least 6 hour prior to surgery   ____  4. Bring all medications with you on the day of surgery if instructed.    __x__ 5. Notify your doctor if there is any change in your medical condition     (cold, fever, infections).    x___6. On the morning of surgery brush your teeth with toothpaste and water.  You may rinse your mouth with mouth wash if you wish.  Do not swallow any toothpaste or mouthwash.   Do not wear jewelry, make-up, hairpins, clips or nail polish.  Do not wear lotions,  powders, or perfumes. You may wear deodorant.  Do not shave 48 hours prior to surgery. Men may shave face and neck.  Do not bring valuables to the hospital.    [facility] is not responsible for any belongings or valuables.               Contacts, dentures or bridgework may not be worn into surgery.  Leave your suitcase in the car. After surgery it may be brought to your room.  For patients admitted to the hospital, discharge time is determined by your                       treatment team.  _  Patients discharged the day of surgery will not be allowed to drive home.  You will need someone to drive you home and stay with you the night of your procedure.    Please read over the following fact sheets that you were given:   [facility] Preparing for Surgery and or MRSA Information   _x___ Take anti-hypertensive listed below, cardiac, seizure, asthma,     anti-reflux and psychiatric medicines. These include:  1. amLODipine (NORVASC) 10 MG tablet  2.  3.  4.  5.  6.  ____Fleets enema or Magnesium Citrate as directed.   _x___  Use CHG Soap or sage wipes as directed on instruction sheet   ____ Use inhalers on the day of surgery and bring to hospital day of surgery  ____ Stop Metformin and Janumet 2 days prior to surgery.    ____ Take 1/2 of usual insulin dose the night before surgery and none on the morning     surgery.   _x___ Follow recommendations from Cardiologist, Pulmonologist or PCP regarding          stopping Aspirin, Coumadin, Plavix ,Eliquis, Effient, or Pradaxa, and Pletal.  X____Stop Anti-inflammatories such as Advil, Aleve, Ibuprofen, Motrin, Naproxen, Naprosyn, Goodies powders or aspirin products. OK to take Tylenol and                          Celebrex.   _x___ Stop supplements until after surgery.  But may continue Vitamin D, Vitamin B,       and multivitamin.   ____ Bring C-Pap to the hospital.

## 2019-02-22 ENCOUNTER — Other Ambulatory Visit
Admission: RE | Admit: 2019-02-22 | Discharge: 2019-02-22 | Disposition: A | Discharge status: Home / Self Care | Payer: BC Managed Care – PPO | Source: Ambulatory Visit | Attending: Surgery | Admitting: Surgery

## 2019-02-22 ENCOUNTER — Other Ambulatory Visit: Payer: BC Managed Care – PPO

## 2019-02-22 ENCOUNTER — Other Ambulatory Visit: Payer: Self-pay

## 2019-02-22 DIAGNOSIS — I10 Essential (primary) hypertension: Secondary | ICD-10-CM | POA: Insufficient documentation

## 2019-02-22 DIAGNOSIS — Z20828 Contact with and (suspected) exposure to other viral communicable diseases: Secondary | ICD-10-CM | POA: Insufficient documentation

## 2019-02-22 DIAGNOSIS — Z01818 Encounter for other preprocedural examination: Secondary | ICD-10-CM | POA: Diagnosis not present

## 2019-02-23 LAB — SARS CORONAVIRUS 2 (TAT 6-24 HRS): SARS Coronavirus 2: NEGATIVE

## 2019-02-26 ENCOUNTER — Ambulatory Visit: Payer: BC Managed Care – PPO | Admitting: Anesthesiology

## 2019-02-26 ENCOUNTER — Encounter: Payer: Self-pay | Admitting: *Deleted

## 2019-02-26 ENCOUNTER — Encounter
Admission: RE | Disposition: A | Discharge status: Home / Self Care | Payer: Self-pay | Source: Home / Self Care | Attending: Surgery

## 2019-02-26 ENCOUNTER — Ambulatory Visit
Admission: RE | Admit: 2019-02-26 | Discharge: 2019-02-26 | Disposition: A | Discharge status: Home / Self Care | Payer: BC Managed Care – PPO | Attending: Surgery | Admitting: Surgery

## 2019-02-26 DIAGNOSIS — Z79899 Other long term (current) drug therapy: Secondary | ICD-10-CM | POA: Diagnosis not present

## 2019-02-26 DIAGNOSIS — F419 Anxiety disorder, unspecified: Secondary | ICD-10-CM | POA: Insufficient documentation

## 2019-02-26 DIAGNOSIS — K432 Incisional hernia without obstruction or gangrene: Secondary | ICD-10-CM | POA: Diagnosis not present

## 2019-02-26 DIAGNOSIS — G4730 Sleep apnea, unspecified: Secondary | ICD-10-CM | POA: Insufficient documentation

## 2019-02-26 DIAGNOSIS — I10 Essential (primary) hypertension: Secondary | ICD-10-CM | POA: Diagnosis not present

## 2019-02-26 HISTORY — PX: INCISIONAL HERNIA REPAIR: SHX193

## 2019-02-26 SURGERY — REPAIR, HERNIA, INCISIONAL
Anesthesia: General

## 2019-02-26 MED ORDER — SEVOFLURANE IN SOLN
RESPIRATORY_TRACT | Status: AC
  Filled 2019-02-26: qty 250

## 2019-02-26 MED ORDER — OXYCODONE HCL 5 MG PO TABS: 5.0000 mg | ORAL_TABLET | ORAL | 0 refills | Status: DC | PRN

## 2019-02-26 MED ORDER — CEFAZOLIN SODIUM-DEXTROSE 2-4 GM/100ML-% IV SOLN
2.0000 g | INTRAVENOUS | Status: AC
  Administered 2019-02-26: 2 g via INTRAVENOUS

## 2019-02-26 MED ORDER — ROCURONIUM BROMIDE 100 MG/10ML IV SOLN
INTRAVENOUS | Status: DC | PRN
  Administered 2019-02-26: 35 mg via INTRAVENOUS
  Administered 2019-02-26: 5 mg via INTRAVENOUS

## 2019-02-26 MED ORDER — FENTANYL CITRATE (PF) 100 MCG/2ML IJ SOLN
INTRAMUSCULAR | Status: AC
  Filled 2019-02-26: qty 2

## 2019-02-26 MED ORDER — ROCURONIUM BROMIDE 50 MG/5ML IV SOLN
INTRAVENOUS | Status: AC
  Filled 2019-02-26: qty 1

## 2019-02-26 MED ORDER — BUPIVACAINE LIPOSOME 1.3 % IJ SUSP: 20.0000 mL | Freq: Once | INTRAMUSCULAR | Status: DC

## 2019-02-26 MED ORDER — GABAPENTIN 300 MG PO CAPS
300.0000 mg | ORAL_CAPSULE | ORAL | Status: AC
  Administered 2019-02-26: 10:00:00 300 mg via ORAL

## 2019-02-26 MED ORDER — FENTANYL CITRATE (PF) 100 MCG/2ML IJ SOLN: 25.0000 ug | INTRAMUSCULAR | Status: DC | PRN

## 2019-02-26 MED ORDER — KETOROLAC TROMETHAMINE 30 MG/ML IJ SOLN
INTRAMUSCULAR | Status: DC | PRN
  Administered 2019-02-26: 30 mg via INTRAVENOUS

## 2019-02-26 MED ORDER — LIDOCAINE HCL (PF) 2 % IJ SOLN
INTRAMUSCULAR | Status: AC
  Filled 2019-02-26: qty 10

## 2019-02-26 MED ORDER — ONDANSETRON HCL 4 MG/2ML IJ SOLN: 4.0000 mg | Freq: Once | INTRAMUSCULAR | Status: DC | PRN

## 2019-02-26 MED ORDER — PROPOFOL 10 MG/ML IV BOLUS
INTRAVENOUS | Status: DC | PRN
  Administered 2019-02-26: 190 mg via INTRAVENOUS

## 2019-02-26 MED ORDER — CHLORHEXIDINE GLUCONATE CLOTH 2 % EX PADS
6.0000 | MEDICATED_PAD | Freq: Once | CUTANEOUS | Status: AC
  Administered 2019-02-26: 6 via TOPICAL

## 2019-02-26 MED ORDER — GABAPENTIN 300 MG PO CAPS
ORAL_CAPSULE | ORAL | Status: AC
  Administered 2019-02-26: 300 mg via ORAL
  Filled 2019-02-26: qty 1

## 2019-02-26 MED ORDER — SUGAMMADEX SODIUM 200 MG/2ML IV SOLN
INTRAVENOUS | Status: DC | PRN
  Administered 2019-02-26: 200 mg via INTRAVENOUS

## 2019-02-26 MED ORDER — PROPOFOL 10 MG/ML IV BOLUS
INTRAVENOUS | Status: AC
  Filled 2019-02-26: qty 20

## 2019-02-26 MED ORDER — ACETAMINOPHEN 500 MG PO TABS
1000.0000 mg | ORAL_TABLET | ORAL | Status: AC
  Administered 2019-02-26: 10:00:00 1000 mg via ORAL

## 2019-02-26 MED ORDER — EPHEDRINE SULFATE 50 MG/ML IJ SOLN
INTRAMUSCULAR | Status: DC | PRN
  Administered 2019-02-26: 10 mg via INTRAVENOUS

## 2019-02-26 MED ORDER — FENTANYL CITRATE (PF) 100 MCG/2ML IJ SOLN
INTRAMUSCULAR | Status: DC | PRN
  Administered 2019-02-26 (×3): 50 ug via INTRAVENOUS

## 2019-02-26 MED ORDER — FAMOTIDINE 20 MG PO TABS
ORAL_TABLET | ORAL | Status: AC
  Administered 2019-02-26: 20 mg via ORAL
  Filled 2019-02-26: qty 1

## 2019-02-26 MED ORDER — ACETAMINOPHEN 500 MG PO TABS
ORAL_TABLET | ORAL | Status: AC
  Administered 2019-02-26: 1000 mg via ORAL
  Filled 2019-02-26: qty 2

## 2019-02-26 MED ORDER — ONDANSETRON HCL 4 MG/2ML IJ SOLN
INTRAMUSCULAR | Status: DC | PRN
  Administered 2019-02-26: 4 mg via INTRAVENOUS

## 2019-02-26 MED ORDER — LACTATED RINGERS IV SOLN
INTRAVENOUS | Status: DC
  Administered 2019-02-26: 10:00:00 via INTRAVENOUS

## 2019-02-26 MED ORDER — FAMOTIDINE 20 MG PO TABS
20.0000 mg | ORAL_TABLET | Freq: Once | ORAL | Status: AC
  Administered 2019-02-26: 10:00:00 20 mg via ORAL

## 2019-02-26 MED ORDER — IBUPROFEN 600 MG PO TABS: 600.0000 mg | ORAL_TABLET | Freq: Three times a day (TID) | ORAL | 0 refills | Status: DC | PRN

## 2019-02-26 MED ORDER — BUPIVACAINE-EPINEPHRINE (PF) 0.25% -1:200000 IJ SOLN
INTRAMUSCULAR | Status: DC | PRN
  Administered 2019-02-26: 30 mL

## 2019-02-26 MED ORDER — BUPIVACAINE LIPOSOME 1.3 % IJ SUSP
INTRAMUSCULAR | Status: DC | PRN
  Administered 2019-02-26: 20 mL

## 2019-02-26 MED ORDER — MIDAZOLAM HCL 2 MG/2ML IJ SOLN
INTRAMUSCULAR | Status: AC
  Filled 2019-02-26: qty 2

## 2019-02-26 MED ORDER — MIDAZOLAM HCL 2 MG/2ML IJ SOLN
INTRAMUSCULAR | Status: DC | PRN
  Administered 2019-02-26: 2 mg via INTRAVENOUS

## 2019-02-26 MED ORDER — CEFAZOLIN SODIUM-DEXTROSE 2-4 GM/100ML-% IV SOLN
INTRAVENOUS | Status: AC
  Filled 2019-02-26: qty 100

## 2019-02-26 MED ORDER — CHLORHEXIDINE GLUCONATE CLOTH 2 % EX PADS: 6.0000 | MEDICATED_PAD | Freq: Once | CUTANEOUS | Status: DC

## 2019-02-26 SURGICAL SUPPLY — 30 items
CANISTER SUCT 1200ML W/VALVE (MISCELLANEOUS) ×2 IMPLANT
CHLORAPREP W/TINT 26 (MISCELLANEOUS) ×2 IMPLANT
COVER WAND RF STERILE (DRAPES) ×2 IMPLANT
DERMABOND ADVANCED (GAUZE/BANDAGES/DRESSINGS) ×1
DERMABOND ADVANCED .7 DNX12 (GAUZE/BANDAGES/DRESSINGS) ×1 IMPLANT
DRAPE LAPAROTOMY 100X77 ABD (DRAPES) ×2 IMPLANT
ELECT CAUTERY BLADE 6.4 (BLADE) ×2 IMPLANT
ELECT REM PT RETURN 9FT ADLT (ELECTROSURGICAL) ×2
ELECTRODE REM PT RTRN 9FT ADLT (ELECTROSURGICAL) ×1 IMPLANT
GLOVE PROTEXIS LATEX SZ 7.5 (GLOVE) ×2 IMPLANT
GLOVE SURG SYN 7.0 (GLOVE) ×2 IMPLANT
GOWN STRL REUS W/ TWL LRG LVL3 (GOWN DISPOSABLE) ×2 IMPLANT
GOWN STRL REUS W/TWL LRG LVL3 (GOWN DISPOSABLE) ×2
MESH VENTRALEX ST 8CM LRG (Mesh General) ×2 IMPLANT
NEEDLE HYPO 22GX1.5 SAFETY (NEEDLE) ×2 IMPLANT
NS IRRIG 500ML POUR BTL (IV SOLUTION) ×2 IMPLANT
PACK BASIN MINOR ARMC (MISCELLANEOUS) ×2 IMPLANT
PENCIL SMOKE EVACUATOR (MISCELLANEOUS) ×2 IMPLANT
SUT ETHIBOND 0 (SUTURE) ×2 IMPLANT
SUT ETHIBOND 0 MO6 C/R (SUTURE) ×2 IMPLANT
SUT MNCRL AB 4-0 PS2 18 (SUTURE) ×2 IMPLANT
SUT PROLENE 2 0 FS (SUTURE) ×4 IMPLANT
SUT PROLENE 2 0 SH DA (SUTURE) ×2 IMPLANT
SUT SILK 0 (SUTURE)
SUT SILK 0 30XBRD TIE 6 (SUTURE) IMPLANT
SUT SILK 0 SH 30 (SUTURE) IMPLANT
SUT VIC AB 0 SH 27 (SUTURE) IMPLANT
SUT VIC AB 3-0 SH 27 (SUTURE) ×2
SUT VIC AB 3-0 SH 27X BRD (SUTURE) ×2 IMPLANT
SYR 10ML LL (SYRINGE) ×2 IMPLANT

## 2019-02-26 NOTE — Transfer of Care (Signed)
Immediate Anesthesia Transfer of Care Note  Patient: Steven Barrett  Procedure(s) Performed: Fatima Blank UMBILICAL HERNIA REPAIR WITH MESH (N/A )  Patient Location: PACU  Anesthesia Type:General  Level of Consciousness: awake, alert  and oriented  Airway & Oxygen Therapy: Patient Spontanous Breathing and Patient connected to face mask oxygen  Post-op Assessment: Report given to RN and Post -op Vital signs reviewed and stable  Post vital signs: Reviewed and stable  Last Vitals:  Vitals Value Taken Time  BP 137/89 02/26/19 1207  Temp    Pulse 65 02/26/19 1210  Resp 15 02/26/19 1210  SpO2 99 % 02/26/19 1210  Vitals shown include unvalidated device data.  Last Pain:  Vitals:   02/26/19 0933  TempSrc: Temporal  PainSc: 0-No pain         Complications: No apparent anesthesia complications

## 2019-02-26 NOTE — Anesthesia Post-op Follow-up Note (Signed)
Anesthesia QCDR form completed.        

## 2019-02-26 NOTE — Op Note (Signed)
Procedure Date:  02/26/2019  Pre-operative Diagnosis:  Incisional supraumbilical hernia  Post-operative Diagnosis:  Incisional supraumbilical hernia  Procedure:  Incisional hernia repair with mesh  Surgeon:  Melvyn Neth, MD  Anesthesia:  General endotracheal  Estimated Blood Loss:  5 ml  Specimens:  Hernia sac  Complications:  None  Indications for Procedure:  This is a 48 y.o. male who presents with an incisional supraumbilical hernia, s/p laparoscopic appendectomy this year.  The risks of bleeding, abscess or infection, injury to surrounding structures, and need for further procedures were all discussed with the patient and was willing to proceed.  Description of Procedure: The patient was correctly identified in the preoperative area and brought into the operating room.  The patient was placed supine with VTE prophylaxis in place.  Appropriate time-outs were performed.  Anesthesia was induced and the patient was intubated.  Appropriate antibiotics were infused.  The abdomen was prepped and draped in a sterile fashion.  The patient's previous vertical supraumbilical scar was excised and cautery was used to dissect down the subcutaneous tissue.  This allowed visualization of the hernia defect at the superior aspect of the incision.  The hernia sac was excised using cautery.  The fascial edges were cleaned using cautery as well.  The defect measured about 2.5 cm.  An 8 cm Ventralex ST mesh was introduced via the defect and spread open.  The tails were secured to the fascia using 2-0 Prolene sutures.  The fascia was then closed with 0 Ethibond sutures, incorporating a layer of the mesh with each suture. The wound was irrigated and 40 ml of Exparel combined with 0.5% bupivacaine with epi was infused onto the fascia and subcutaneous tissue.  The wound was then closed in multiple layers using 2-0 Vicryl, 3-0 Vicryl, and 4-0 Monocryl. The incision was cleaned and sealed with DermaBond.  The  patient was emerged from anesthesia and extubated and brought to the recovery room for further management.  The patient tolerated the procedure well and all counts were correct at the end of the case.   Melvyn Neth, MD

## 2019-02-26 NOTE — Anesthesia Procedure Notes (Signed)
Procedure Name: Intubation Date/Time: 02/26/2019 10:10 AM Performed by: Allean Found, CRNA Pre-anesthesia Checklist: Patient identified, Patient being monitored, Timeout performed, Emergency Drugs available and Suction available Patient Re-evaluated:Patient Re-evaluated prior to induction Oxygen Delivery Method: Circle system utilized Preoxygenation: Pre-oxygenation with 100% oxygen Induction Type: IV induction Ventilation: Mask ventilation without difficulty Laryngoscope Size: 3 and McGraph Grade View: Grade II Tube type: Oral Tube size: 7.0 mm Number of attempts: 1 Airway Equipment and Method: Stylet Placement Confirmation: ETT inserted through vocal cords under direct vision,  positive ETCO2 and breath sounds checked- equal and bilateral Secured at: 21 cm Tube secured with: Tape Dental Injury: Teeth and Oropharynx as per pre-operative assessment

## 2019-02-26 NOTE — Interval H&P Note (Signed)
History and Physical Interval Note:  02/26/2019 9:21 AM  Kerin Perna  has presented today for surgery, with the diagnosis of Incisional umbilical hernia.  The various methods of treatment have been discussed with the patient and family. After consideration of risks, benefits and other options for treatment, the patient has consented to  Procedure(s): HERNIA REPAIR INCISIONAL (N/A) as a surgical intervention.  The patient's history has been reviewed, patient examined, no change in status, stable for surgery.  I have reviewed the patient's chart and labs.  Questions were answered to the patient's satisfaction.     Steven Barrett

## 2019-02-26 NOTE — Discharge Instructions (Signed)
Open Hernia Repair, Adult, Care After °These instructions give you information about caring for yourself after your procedure. Your doctor may also give you more specific instructions. If you have problems or questions, contact your doctor. °Follow these instructions at home: °Surgical cut (incision) care ° °· Follow instructions from your doctor about how to take care of your surgical cut area. Make sure you: °? Wash your hands with soap and water before you change your bandage (dressing). If you cannot use soap and water, use hand sanitizer. °? Change your bandage as told by your doctor. °? Leave stitches (sutures), skin glue, or skin tape (adhesive) strips in place. They may need to stay in place for 2 weeks or longer. If tape strips get loose and curl up, you may trim the loose edges. Do not remove tape strips completely unless your doctor says it is okay. °· Check your surgical cut every day for signs of infection. Check for: °? More redness, swelling, or pain. °? More fluid or blood. °? Warmth. °? Pus or a bad smell. °Activity °· Do not drive or use heavy machinery while taking prescription pain medicine. Do not drive until your doctor says it is okay. °· Until your doctor says it is okay: °? Do not lift anything that is heavier than 10 lb (4.5 kg). °? Do not play contact sports. °· Return to your normal activities as told by your doctor. Ask your doctor what activities are safe. °General instructions °· To prevent or treat having a hard time pooping (constipation) while you are taking prescription pain medicine, your doctor may recommend that you: °? Drink enough fluid to keep your pee (urine) clear or pale yellow. °? Take over-the-counter or prescription medicines. °? Eat foods that are high in fiber, such as fresh fruits and vegetables, whole grains, and beans. °? Limit foods that are high in fat and processed sugars, such as fried and sweet foods. °· Take over-the-counter and prescription medicines only as  told by your doctor. °· Do not take baths, swim, or use a hot tub until your doctor says it is okay. °· Keep all follow-up visits as told by your doctor. This is important. °Contact a doctor if: °· You develop a rash. °· You have more redness, swelling, or pain around your surgical cut. °· You have more fluid or blood coming from your surgical cut. °· Your surgical cut feels warm to the touch. °· You have pus or a bad smell coming from your surgical cut. °· You have a fever or chills. °· You have blood in your poop (stool). °· You have not pooped in 2-3 days. °· Medicine does not help your pain. °Get help right away if: °· You have chest pain or you are short of breath. °· You feel light-headed. °· You feel weak and dizzy (feel faint). °· You have very bad pain. °· You throw up (vomit) and your pain is worse. °This information is not intended to replace advice given to you by your health care provider. Make sure you discuss any questions you have with your health care provider. °Document Released: 03/29/2014 Document Revised: 06/30/2018 Document Reviewed: 08/20/2015 °Elsevier Patient Education © 2020 [employer] ° ° °AMBULATORY SURGERY  °DISCHARGE INSTRUCTIONS ° ° °1) The drugs that you were given will stay in your system until tomorrow so for the next 24 hours you should not: ° °A) Drive an automobile °B) Make any legal decisions °C) Drink any alcoholic beverage ° ° °2) You   may resume regular meals tomorrow.  Today it is better to start with liquids and gradually work up to solid foods. ° °You may eat anything you prefer, but it is better to start with liquids, then soup and crackers, and gradually work up to solid foods. ° ° °3) Please notify your doctor immediately if you have any unusual bleeding, trouble breathing, redness and pain at the surgery site, drainage, fever, or pain not relieved by medication. ° ° ° °4) Additional Instructions: ° ° ° ° ° ° ° °Please contact your physician with any problems or Same  Day Surgery at [phone], Monday through Friday 6 am to 4 pm, or [place] [facility] [place] Main number at [phone]. ° °

## 2019-02-26 NOTE — Anesthesia Preprocedure Evaluation (Signed)
Anesthesia Evaluation  Patient identified by MRN, date of birth, ID band Patient awake    Reviewed: Allergy & Precautions, H&P , NPO status , Patient's Chart, lab work & pertinent test results, reviewed documented beta blocker date and time   Airway Mallampati: III  TM Distance: >3 FB Neck ROM: full    Dental  (+) Teeth Intact   Pulmonary sleep apnea and Continuous Positive Airway Pressure Ventilation ,    Pulmonary exam normal        Cardiovascular Exercise Tolerance: Good hypertension, On Medications negative cardio ROS Normal cardiovascular exam Rhythm:regular Rate:Normal     Neuro/Psych Anxiety negative neurological ROS  negative psych ROS   GI/Hepatic negative GI ROS, Neg liver ROS,   Endo/Other  negative endocrine ROS  Renal/GU negative Renal ROS  negative genitourinary   Musculoskeletal   Abdominal   Peds  Hematology negative hematology ROS (+)   Anesthesia Other Findings Past Medical History: No date: Anxiety No date: Hypertension No date: Sleep apnea No date: Stye external     Comment:  LEFT EYE/  RESOLVING Past Surgical History: No date: KNEE SURGERY; Right 04/05/2018: LAPAROSCOPIC APPENDECTOMY; N/A     Comment:  Procedure: APPENDECTOMY LAPAROSCOPIC;  Surgeon: Olean Ree, MD;  Location: [facility] ORS;  Service: General;                Laterality: N/A;   Reproductive/Obstetrics negative OB ROS                             Anesthesia Physical Anesthesia Plan  ASA: III  Anesthesia Plan: General ETT   Post-op Pain Management:    Induction:   PONV Risk Score and Plan:   Airway Management Planned:   Additional Equipment:   Intra-op Plan:   Post-operative Plan:   Informed Consent: I have reviewed the patients History and Physical, chart, labs and discussed the procedure including the risks, benefits and alternatives for the proposed anesthesia with the  patient or authorized representative who has indicated his/her understanding and acceptance.     Dental Advisory Given  Plan Discussed with: CRNA  Anesthesia Plan Comments:         Anesthesia Quick Evaluation

## 2019-02-27 ENCOUNTER — Encounter: Payer: Self-pay | Admitting: Surgery

## 2019-02-27 LAB — SURGICAL PATHOLOGY

## 2019-02-28 NOTE — Anesthesia Postprocedure Evaluation (Signed)
Anesthesia Post Note  Patient: NESANEL AGUILA  Procedure(s) Performed: INCISIONAL UMBILICAL HERNIA REPAIR WITH MESH (N/A )  Patient location during evaluation: PACU Anesthesia Type: General Level of consciousness: awake and alert Pain management: pain level controlled Vital Signs Assessment: post-procedure vital signs reviewed and stable Respiratory status: spontaneous breathing, nonlabored ventilation, respiratory function stable and patient connected to nasal cannula oxygen Cardiovascular status: blood pressure returned to baseline and stable Postop Assessment: no apparent nausea or vomiting Anesthetic complications: no     Last Vitals:  Vitals:   02/26/19 1239 02/26/19 1248  BP: 122/88 123/81  Pulse: 60 (!) 56  Resp: 17 18  Temp: (!) 36.4 C (!) 36.4 C  SpO2: 97% 99%    Last Pain:  Vitals:   02/26/19 1248  TempSrc:   PainSc: 4                  Molli Barrows

## 2019-03-05 ENCOUNTER — Other Ambulatory Visit: Payer: Self-pay | Admitting: Surgery

## 2019-03-19 ENCOUNTER — Telehealth: Payer: Self-pay

## 2019-03-19 NOTE — Telephone Encounter (Signed)
Copied from [place] [phone]. Topic: General - Call Back - No Documentation >> Mar 19, 2019  1:11 PM Erick Blinks wrote: [phone] Pt is requesting a call back from nurse in regards to his CPAP

## 2019-03-20 ENCOUNTER — Encounter: Payer: Self-pay | Admitting: Physician Assistant

## 2019-03-21 ENCOUNTER — Telehealth: Payer: Self-pay | Admitting: Physician Assistant

## 2019-03-21 NOTE — Telephone Encounter (Signed)
Work note sent.  

## 2019-03-28 ENCOUNTER — Telehealth: Payer: Self-pay | Admitting: *Deleted

## 2019-03-28 ENCOUNTER — Other Ambulatory Visit: Payer: Self-pay

## 2019-03-28 ENCOUNTER — Telehealth (INDEPENDENT_AMBULATORY_CARE_PROVIDER_SITE_OTHER): Payer: Self-pay | Admitting: Surgery

## 2019-03-28 DIAGNOSIS — Z09 Encounter for follow-up examination after completed treatment for conditions other than malignant neoplasm: Secondary | ICD-10-CM

## 2019-03-28 DIAGNOSIS — K432 Incisional hernia without obstruction or gangrene: Secondary | ICD-10-CM

## 2019-03-28 NOTE — Progress Notes (Signed)
Virtual Visit via Telephone Note  I connected with Steven Barrett on 03/28/19 at 10:00 AM EST by telephone and verified that I am speaking with the correct person using two identifiers.  Location: Patient:  Home Provider:   Office   I discussed the limitations, risks, security and privacy concerns of performing an evaluation and management service by telephone and the availability of in person appointments. I also discussed with the patient that there may be a patient responsible charge related to this service. The patient expressed understanding and agreed to proceed.   History of Present Illness: This is a 49 yo male s/p incisional hernia repair with mesh on 12/7.  He had an umbilical hernia as a result of prior laparoscopic appendectomy on 04/05/18.  He reports that he's been doing well.  Initially he had discomfort and tightness with the mesh but now it's resolved.  Denies any worsening pain, or any issues with the incision.  There's been no drainage, erythema, or bulging.   Observations/Objective: Does not appear to be in any acute distress and is able to converse in full sentences without any shortness of breath.  Assessment and Plan: 49 yo male s/p incisional hernia repair with mesh.  Discussed with the patient that he appears to be healing well without any complications.  He is about 4 weeks from his surgery and can now resume normal activities.  I asked him to refrain from too strenuous activities until 6 weeks out as a precaution, given that he had an incisional hernia.    Otherwise, at 6 weeks' time, he can do any activity he wants.    He can follow up with [name] on an as needed basis.   Follow Up Instructions:    I discussed the assessment and treatment plan with the patient. The patient was provided an opportunity to ask questions and all were answered. The patient agreed with the plan and demonstrated an understanding of the instructions.   The patient was advised to call  back or seek an in-person evaluation if the symptoms worsen or if the condition fails to improve as anticipated.  I provided 15 minutes of non-face-to-face time during this encounter.   Henrene Dodge, MD

## 2019-03-28 NOTE — Telephone Encounter (Signed)
Faxed FMLA [employer] [phone]

## 2019-03-29 NOTE — Telephone Encounter (Signed)
Left voicemail for patient to return call.

## 2019-04-05 NOTE — Telephone Encounter (Signed)
Patient states that he send a message via MyChart and was waiting for a response and I advised patient that Ricki Rodriguez had responded to his message via MyChart. 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 Also the doctor want him to have 30 days with good reading. Patient states that he goes back for another check in the end of March,2021 for his DOT physical.FYI

## 2019-04-05 NOTE — Telephone Encounter (Signed)
Yes he is supposed to tell me if I need to order separately or refax it to his CPAP company.

## 2019-04-09 ENCOUNTER — Telehealth: Payer: Self-pay | Admitting: Surgery

## 2019-04-09 NOTE — Telephone Encounter (Signed)
Patient is requesting a call back from a member of the clinical team to discuss a release to return to work.  Steven Barrett is best reached back @ [phone].  Thank you

## 2019-04-09 NOTE — Telephone Encounter (Signed)
Spoke with patient to gather more information about work release note. Patient states he needs a work release note stating he may return back to work tomorrow night 04/10/19. Patient notified the work note would be completed and placed at the front desk for pick up. Patient verbalized understanding and says he will pick up the work note tomorrow.

## 2019-04-10 ENCOUNTER — Encounter: Payer: Self-pay | Admitting: Physician Assistant

## 2019-04-11 ENCOUNTER — Telehealth: Payer: Self-pay | Admitting: Emergency Medicine

## 2019-04-11 ENCOUNTER — Encounter: Payer: Self-pay | Admitting: Surgery

## 2019-04-11 NOTE — Telephone Encounter (Signed)
Can we call [employer] and see if his CPAP is auto titrating or needs a fixed pressure setting? Can you ask what the current pressure is? Then I will write new script and we will fax. Please see mychart message.

## 2019-04-11 NOTE — Telephone Encounter (Signed)
Called patient in regards to trying to put work note into [place], unfortunately no one is unsure of how to do that so patient must pick up work note at the office or if we can get a fax number. No answer, left vm to give the office a call back.

## 2019-04-12 ENCOUNTER — Telehealth: Payer: Self-pay | Admitting: Emergency Medicine

## 2019-04-12 NOTE — Telephone Encounter (Signed)
Called patient to let him know we sent the letter via Mychart. No answer, left vm to call back to the office.

## 2019-06-20 DIAGNOSIS — H00022 Hordeolum internum right lower eyelid: Secondary | ICD-10-CM | POA: Diagnosis not present

## 2019-06-20 DIAGNOSIS — H01022 Squamous blepharitis right lower eyelid: Secondary | ICD-10-CM | POA: Diagnosis not present

## 2019-06-20 DIAGNOSIS — H02886 Meibomian gland dysfunction of left eye, unspecified eyelid: Secondary | ICD-10-CM | POA: Diagnosis not present

## 2019-06-20 DIAGNOSIS — H02883 Meibomian gland dysfunction of right eye, unspecified eyelid: Secondary | ICD-10-CM | POA: Diagnosis not present

## 2019-07-20 ENCOUNTER — Ambulatory Visit (INDEPENDENT_AMBULATORY_CARE_PROVIDER_SITE_OTHER): Payer: BC Managed Care – PPO | Admitting: Physician Assistant

## 2019-07-20 DIAGNOSIS — F419 Anxiety disorder, unspecified: Secondary | ICD-10-CM | POA: Diagnosis not present

## 2019-07-20 DIAGNOSIS — G4730 Sleep apnea, unspecified: Secondary | ICD-10-CM | POA: Diagnosis not present

## 2019-07-20 DIAGNOSIS — I10 Essential (primary) hypertension: Secondary | ICD-10-CM | POA: Diagnosis not present

## 2019-07-20 MED ORDER — SERTRALINE HCL 100 MG PO TABS: 100.0000 mg | ORAL_TABLET | Freq: Every day | ORAL | 1 refills | Status: AC

## 2019-07-20 NOTE — Progress Notes (Signed)
Virtual telephone visit    Virtual Visit via Telephone Note   This visit type was conducted due to national recommendations for restrictions regarding the COVID-19 Pandemic (e.g. social distancing) in an effort to limit this patient's exposure and mitigate transmission in our community. Due to his co-morbid illnesses, this patient is at least at moderate risk for complications without adequate follow up. This format is felt to be most appropriate for this patient at this time. The patient did not have access to video technology or had technical difficulties with video requiring transitioning to audio format only (telephone). Physical exam was limited to content and character of the telephone converstion.    Patient location: Home Provider location: Office   Visit Date: 07/20/2019  Today's healthcare provider: Trey Sailors, [name]   Chief Complaint  Patient presents with  . Hypertension  . Anxiety  . Depression   Subjective    HPI Hypertension, follow-up  BP Readings from Last 3 Encounters:  02/26/19 123/81  01/03/19 133/81  10/24/18 140/83   Wt Readings from Last 3 Encounters:  02/20/19 200 lb (90.7 kg)  01/03/19 209 lb (94.8 kg)  10/24/18 211 lb (95.7 kg)     He was last seen for hypertension 6 months ago.  BP at that visit was 133/81. Management since that visit includes patient reports he has stopped taking his Amlodipine and Lisinopril and his blood pressure has not been elevated. 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   He reports good compliance with treatment. He is not having side effects.  He is following a Regular diet. He is exercising. He does not smoke.  Use of agents associated with hypertension: none.   Outside blood pressures are . Symptoms: No chest pain No chest pressure No palpitations No dyspnea No orthopnea No paroxysmal nocturnal dyspnea No lower extremity edema No syncope   Pertinent labs: Lab Results    Component Value Date   CHOL 122 01/20/2018   HDL 29 (L) 01/20/2018   LDLCALC 46 01/20/2018   TRIG 236 (H) 01/20/2018   CHOLHDL 4.2 01/20/2018   Lab Results  Component Value Date   NA 138 03/28/2018   K 4.0 03/28/2018   CO2 26 03/28/2018   GLUCOSE 108 (H) 03/28/2018   BUN 13 03/28/2018   CREATININE 0.90 03/28/2018   CALCIUM 9.0 03/28/2018   GFRNONAA >60 03/28/2018   GFRAA >60 03/28/2018     The ASCVD Risk score (Goff DC Jr., et al., 2013) failed to calculate for the following reasons:   The valid total cholesterol range is 130 to 320 mg/dL   ---------------------------------------------------------------------------------------------------   Depression & Anxiety, Follow-up  He  was last seen for this 6 months ago. Changes made at last visit include none.   He reports good compliance with treatment. He is not having side effects.   He reports good tolerance of treatment. Current symptoms include: anxious mainly at work. Reports some increased work stressors.  He feels he is Improved since last visit.  Depression screen [facility] 2/9 07/20/2019 01/20/2018  Decreased Interest 0 1  Down, Depressed, Hopeless 0 0  PHQ - 2 Score 0 1  Altered sleeping 0 -  Tired, decreased energy 0 -  Change in appetite 0 -  Feeling bad or failure about yourself  0 -  Trouble concentrating 0 -  Moving slowly or fidgety/restless 0 -  Suicidal thoughts 0 -  PHQ-9 Score 0 -  Difficult doing work/chores Not difficult  at all -    GAD 7 : Generalized Anxiety Score 07/20/2019  Nervous, Anxious, on Edge 2  Control/stop worrying 0  Worry too much - different things 0  Trouble relaxing 0  Restless 0  Easily annoyed or irritable 0  Afraid - awful might happen 0  Total GAD 7 Score 2  Anxiety Difficulty Somewhat difficult   Sleep Apnea:   Currently using CPAP machine for sleep apnea but is having difficulty with this. Patient reports waking up every 2 hours. He has gotten a new mask from [employer]  to help with this.  -----------------------------------------------------------------------------------------     Medications: Outpatient Medications Prior to Visit  Medication Sig  . ibuprofen (ADVIL) 600 MG tablet Take 1 tablet (600 mg total) by mouth every 8 (eight) hours as needed for mild pain or moderate pain.  Marland Kitchen trimethoprim-polymyxin b (POLYTRIM) ophthalmic solution Place 1 drop into the right eye every 2 (two) hours.  . [DISCONTINUED] sertraline (ZOLOFT) 50 MG tablet Take 1 tablet (50 mg total) by mouth daily. (Patient taking differently: Take 50 mg by mouth at bedtime. )  . [DISCONTINUED] amLODipine (NORVASC) 10 MG tablet Take 1 tablet (10 mg total) by mouth daily. (Patient not taking: Reported on 07/20/2019)  . [DISCONTINUED] lisinopril (PRINIVIL,ZESTRIL) 20 MG tablet Take 1 tablet (20 mg total) by mouth daily. (Patient not taking: Reported on 07/20/2019)  . [DISCONTINUED] oxyCODONE (OXY IR/ROXICODONE) 5 MG immediate release tablet Take 1 tablet (5 mg total) by mouth every 4 (four) hours as needed for severe pain. (Patient not taking: Reported on 07/20/2019)   No facility-administered medications prior to visit.    Review of Systems     Objective    There were no vitals taken for this visit.      Assessment & Plan    1. Hypertension, unspecified type  Hold medications for now. Observe at next follow up. Follow up in one year.   2. Anxiety  Increase from 50 mg daily to 100 mg daily. Follow up in one year.   - sertraline (ZOLOFT) 100 MG tablet; Take 1 tablet (100 mg total) by mouth daily.  Dispense: 90 tablet; Refill: 1    Return if symptoms worsen or fail to improve.    I discussed the assessment and treatment plan with the patient. The patient was provided an opportunity to ask questions and all were answered. The patient agreed with the plan and demonstrated an understanding of the instructions.   The patient was advised to call back or seek an in-person  evaluation if the symptoms worsen or if the condition fails to improve as anticipated.  I provided 25 minutes of non-face-to-face time during this encounter.    Paulene Floor [facility] [phone] (phone) [phone] (fax)  [place]

## 2019-07-20 NOTE — Patient Instructions (Signed)

## 2019-08-05 ENCOUNTER — Encounter: Payer: Self-pay | Admitting: Physician Assistant

## 2019-08-16 DIAGNOSIS — R42 Dizziness and giddiness: Secondary | ICD-10-CM | POA: Diagnosis not present

## 2019-08-16 DIAGNOSIS — I10 Essential (primary) hypertension: Secondary | ICD-10-CM | POA: Diagnosis not present

## 2019-08-16 DIAGNOSIS — M546 Pain in thoracic spine: Secondary | ICD-10-CM | POA: Diagnosis not present

## 2019-08-16 DIAGNOSIS — M545 Low back pain: Secondary | ICD-10-CM | POA: Diagnosis not present

## 2019-08-18 DIAGNOSIS — R51 Headache with orthostatic component, not elsewhere classified: Secondary | ICD-10-CM | POA: Diagnosis not present

## 2019-08-18 DIAGNOSIS — R519 Headache, unspecified: Secondary | ICD-10-CM | POA: Diagnosis not present

## 2019-08-18 DIAGNOSIS — Z6828 Body mass index (BMI) 28.0-28.9, adult: Secondary | ICD-10-CM | POA: Diagnosis not present

## 2019-08-18 DIAGNOSIS — R6889 Other general symptoms and signs: Secondary | ICD-10-CM | POA: Diagnosis not present

## 2019-08-18 DIAGNOSIS — R0602 Shortness of breath: Secondary | ICD-10-CM | POA: Diagnosis not present

## 2019-08-18 DIAGNOSIS — R001 Bradycardia, unspecified: Secondary | ICD-10-CM | POA: Diagnosis not present

## 2019-08-18 DIAGNOSIS — Z79899 Other long term (current) drug therapy: Secondary | ICD-10-CM | POA: Diagnosis not present

## 2019-08-18 DIAGNOSIS — R0989 Other specified symptoms and signs involving the circulatory and respiratory systems: Secondary | ICD-10-CM | POA: Diagnosis not present

## 2019-08-18 DIAGNOSIS — I10 Essential (primary) hypertension: Secondary | ICD-10-CM | POA: Diagnosis not present

## 2019-08-18 DIAGNOSIS — R42 Dizziness and giddiness: Secondary | ICD-10-CM | POA: Diagnosis not present

## 2019-09-11 ENCOUNTER — Institutional Professional Consult (permissible substitution): Payer: BC Managed Care – PPO | Admitting: Pulmonary Disease

## 2019-09-11 DIAGNOSIS — R05 Cough: Secondary | ICD-10-CM | POA: Diagnosis not present

## 2019-09-11 DIAGNOSIS — Z20822 Contact with and (suspected) exposure to covid-19: Secondary | ICD-10-CM | POA: Diagnosis not present

## 2019-09-17 DIAGNOSIS — G4733 Obstructive sleep apnea (adult) (pediatric): Secondary | ICD-10-CM | POA: Diagnosis not present

## 2019-09-17 DIAGNOSIS — I10 Essential (primary) hypertension: Secondary | ICD-10-CM | POA: Diagnosis not present

## 2019-09-17 DIAGNOSIS — G8929 Other chronic pain: Secondary | ICD-10-CM | POA: Diagnosis not present

## 2019-09-17 DIAGNOSIS — M25562 Pain in left knee: Secondary | ICD-10-CM | POA: Diagnosis not present

## 2019-10-31 DIAGNOSIS — U071 COVID-19: Secondary | ICD-10-CM | POA: Diagnosis not present

## 2019-10-31 DIAGNOSIS — R05 Cough: Secondary | ICD-10-CM | POA: Diagnosis not present

## 2019-10-31 DIAGNOSIS — R509 Fever, unspecified: Secondary | ICD-10-CM | POA: Diagnosis not present

## 2019-11-09 IMAGING — CT CT ABD-PELV W/ CM
2 of 5 series · 15 of 46 positions shown, 17 images · IV contrast (APPLIED)
Comparison: None.

Addendum:
CLINICAL DATA: Bilateral lower abdominal pain periumbilical region
2 days. Central abdominal pain in the umbilical region 1 month ago.
Diarrhea. Evaluate for appendicitis.

EXAM:
CT ABDOMEN AND PELVIS WITH CONTRAST
TECHNIQUE: Multidetector CT imaging of the abdomen and pelvis was performed
using the standard protocol following bolus administration of
intravenous contrast.
CONTRAST:  100mL OMNIPAQUE IOHEXOL 300 MG/ML  SOLN

[Series 2: routine abd/pel with · axial · 0.78mm/px · z∈[-520,-60]mm · 12 of 104 slices shown, 14 images]
[im 6/104  soft-tissue]
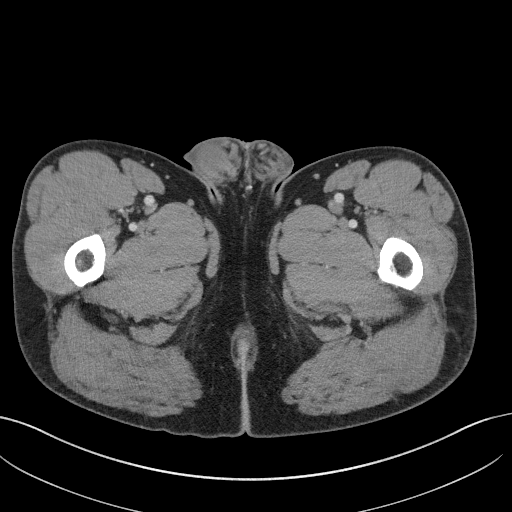
[im 6/104  bone]
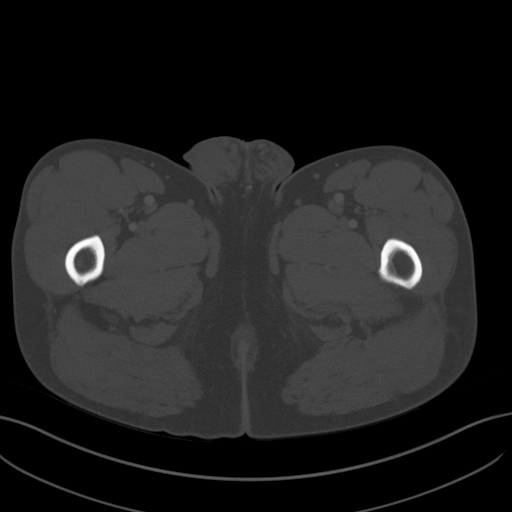
[im 17/104  soft-tissue]
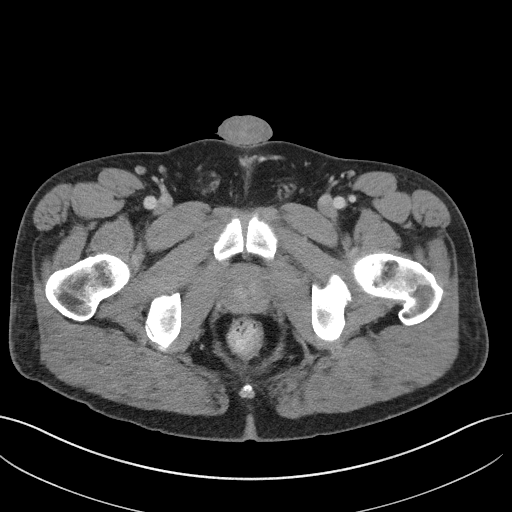
[im 22/104  soft-tissue]
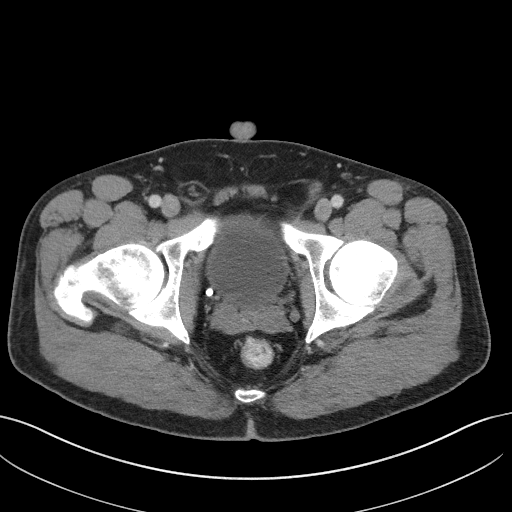
[im 33/104  soft-tissue]
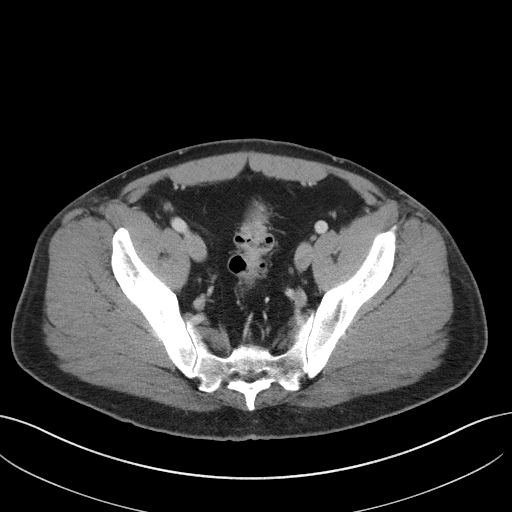
[im 38/104  soft-tissue]
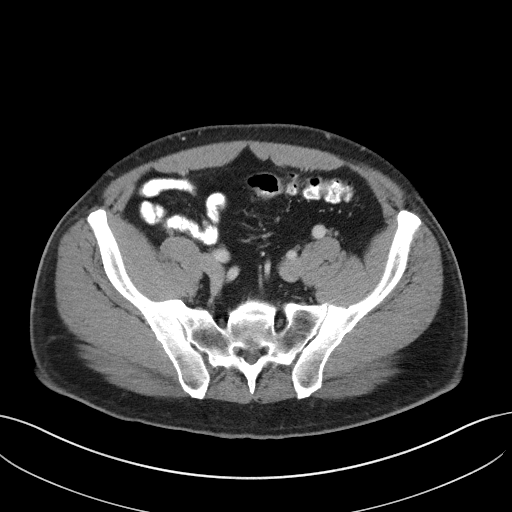
[im 49/104  soft-tissue]
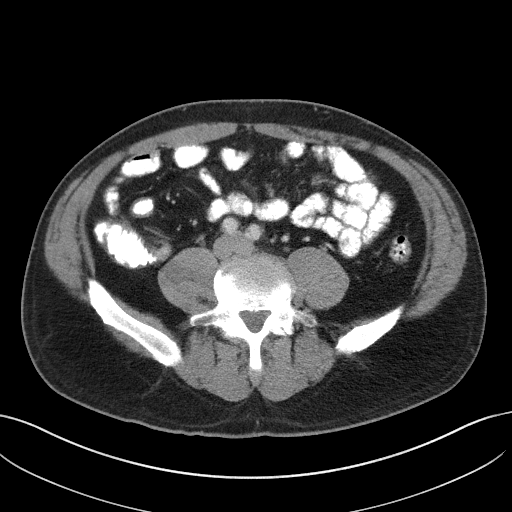
[im 55/104  soft-tissue]
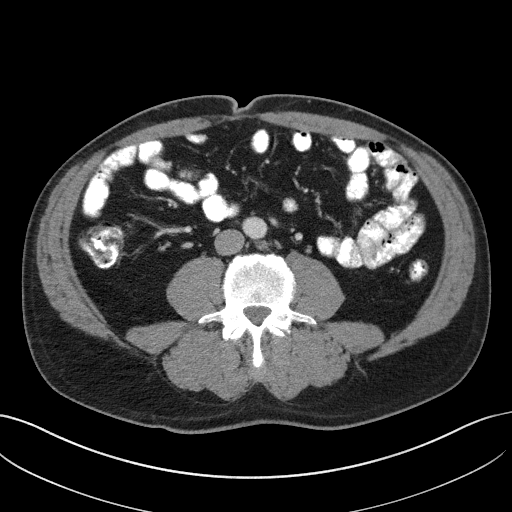
[im 66/104  soft-tissue]
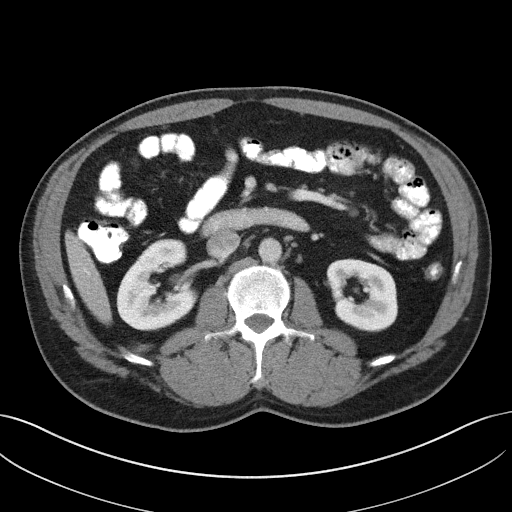
[im 71/104  soft-tissue]
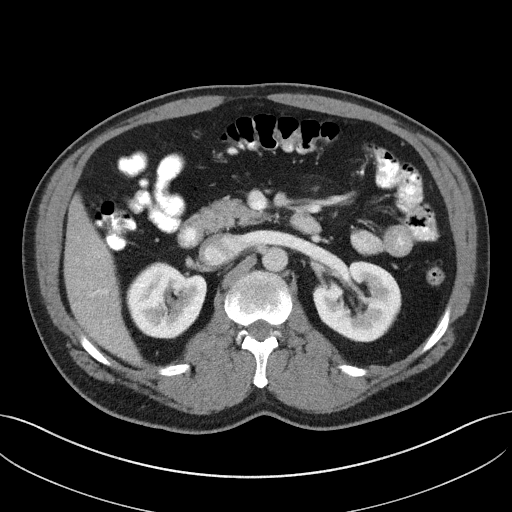
[im 71/104  bone]
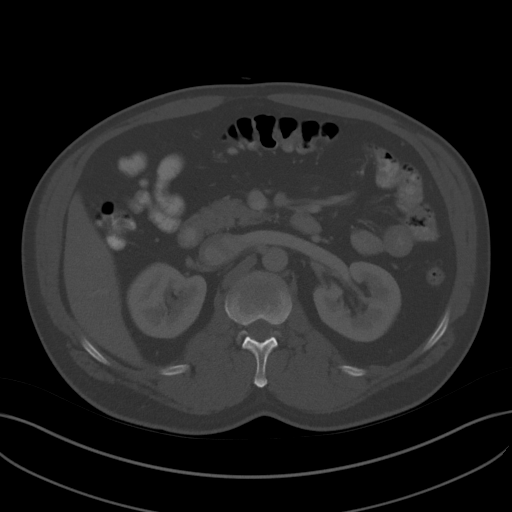
[im 82/104  soft-tissue]
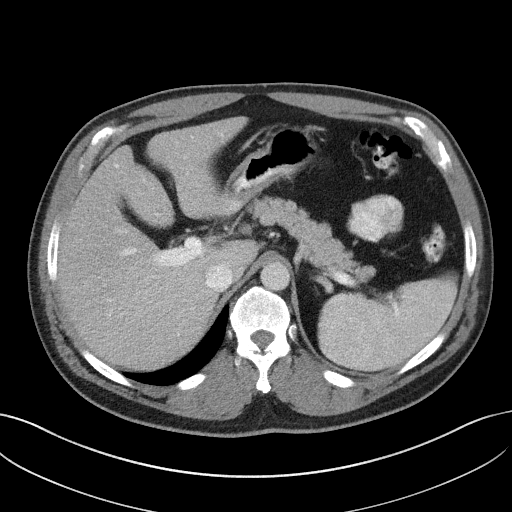
[im 87/104  soft-tissue]
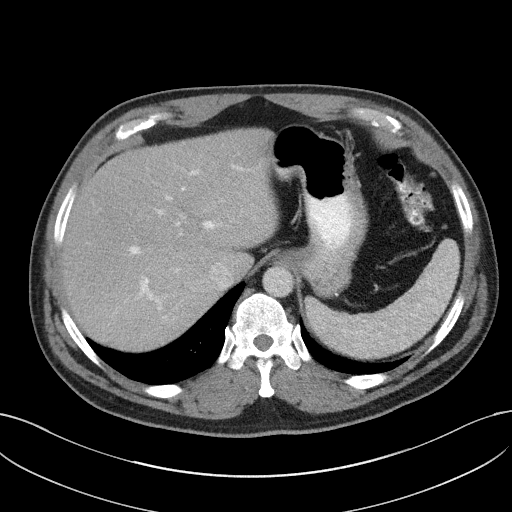
[im 98/104  soft-tissue]
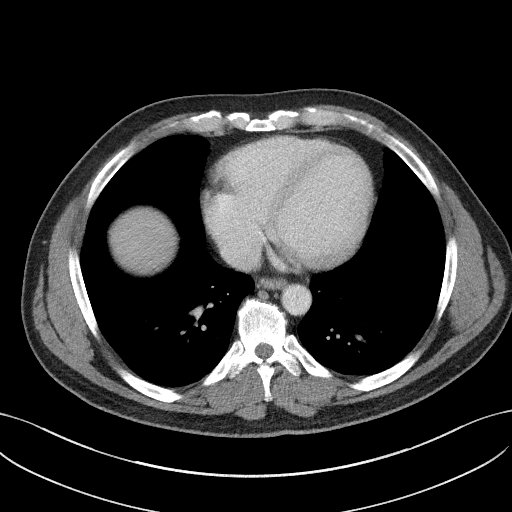

[Series 5: coronal st · coronal · 0.78mm/px · 3 of 89 slices shown]
[im 30/89  soft-tissue]
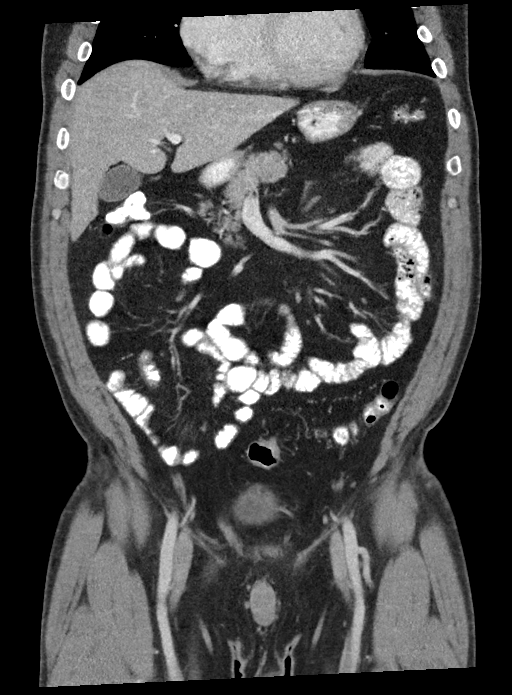
[im 40/89  soft-tissue]
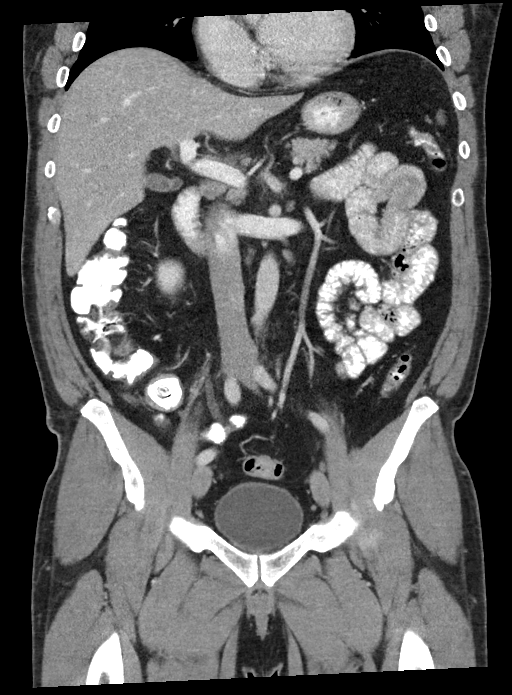
[im 49/89  soft-tissue]
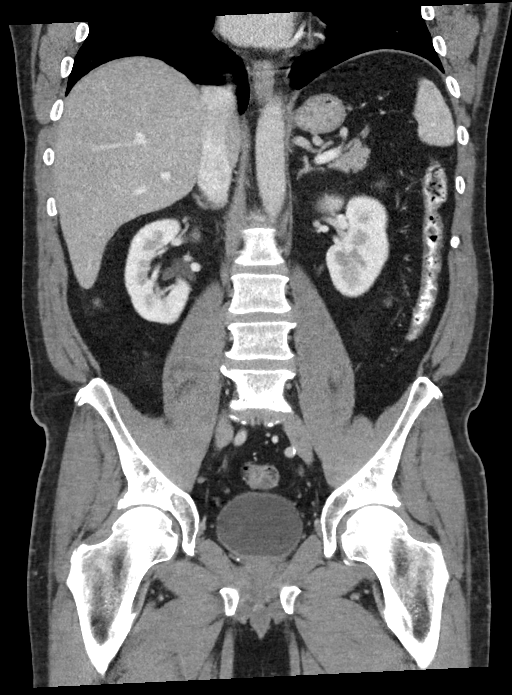

[15 of 46 positions shown; findings below may reference images not displayed]

FINDINGS: Lower chest: Lung bases are normal.

Hepatobiliary: Liver, gallbladder and biliary tree are normal.

Pancreas: Normal.

Spleen: Normal.

Adrenals/Urinary Tract: Adrenal glands are normal. Kidneys are
normal in size without evidence of hydronephrosis. There is a 2 mm
nonobstructing stone over the mid to lower pole right kidney.
Ureters and bladder are normal.

Stomach/Bowel: Stomach and small bowel are normal. The appendix is
enlarged measuring approximately 2.6 cm containing contrast
surrounding a 1.7 cm appendicolith proximally. There is a second
cm appendicolith distally. There is no adjacent free fluid or
inflammatory change. There is no free peritoneal air. Colon is
unremarkable.

Vascular/Lymphatic: Normal.

Reproductive: Normal.

Other: None.

Musculoskeletal: Mild degenerative change over the lower lumbar
spine and hips.
IMPRESSION: Enlarged appendix measuring 2.6 cm in diameter containing 2
appendicoliths with the larger measuring 1.7 cm in the proximal
appendix. No significant adjacent free fluid or inflammatory change.
Although no classic signs of acute inflammation, the appendix is
abnormal and findings may be due to a subacute to chronic
appendicitis.

2 mm nonobstructing right renal stone.

ADDENDUM:
I discussed the findings with Cakir Mebrahtom PA at [DATE] p.m.
03/24/2018. She stated patient had a CT scan within the past 2
months at a [HOSPITAL] facility describing similar findings related to the
appendix. We discussed that this likely represents a chronic process
and talked about possibility of chronic appendicitis or other
chronic appendiceal conditions such as a mucocele.

*** End of Addendum ***

## 2020-02-07 DIAGNOSIS — G4733 Obstructive sleep apnea (adult) (pediatric): Secondary | ICD-10-CM | POA: Diagnosis not present

## 2020-02-07 DIAGNOSIS — I10 Essential (primary) hypertension: Secondary | ICD-10-CM | POA: Diagnosis not present

## 2020-02-07 DIAGNOSIS — Z720 Tobacco use: Secondary | ICD-10-CM | POA: Diagnosis not present

## 2023-01-10 ENCOUNTER — Ambulatory Visit (INDEPENDENT_AMBULATORY_CARE_PROVIDER_SITE_OTHER): Payer: No Typology Code available for payment source

## 2023-01-10 ENCOUNTER — Ambulatory Visit
Admission: EM | Admit: 2023-01-10 | Discharge: 2023-01-10 | Disposition: A | Discharge status: Home / Self Care | Payer: No Typology Code available for payment source | Attending: Physician Assistant | Admitting: Physician Assistant

## 2023-01-10 ENCOUNTER — Encounter: Payer: Self-pay | Admitting: Emergency Medicine

## 2023-01-10 DIAGNOSIS — S62660A Nondisplaced fracture of distal phalanx of right index finger, initial encounter for closed fracture: Secondary | ICD-10-CM

## 2023-01-10 MED ORDER — IBUPROFEN 800 MG PO TABS: 800.0000 mg | ORAL_TABLET | Freq: Three times a day (TID) | ORAL | 0 refills | Status: DC | PRN

## 2023-01-10 NOTE — Discharge Instructions (Addendum)
-  The x-ray shows a fracture of your distal index finger. - Wear splint and avoid use of this finger until cleared by orthopedics.  Contact one of the offices below for follow-up. - I will send you a message later on with the official radiology report and it will also be accessible for you to view. - Ice and elevate the finger. - If you notice increased redness, increased swelling, worsening pain you need to be seen again right away.  You have a condition requiring you to follow up with Orthopedics so please call one of the following office for appointment:   [facility] Address: [address] [place], [place] [address] Phone: [phone]  [facility] [address], [place], [place] [address] Phone: [phone]  [facility] [address], [place], [place] [address] Phone: [phone]

## 2023-01-10 NOTE — ED Provider Notes (Signed)
[facility]    CSN: [id number] Arrival date & time: 01/10/23  1435      History   Chief Complaint Chief Complaint  Patient presents with   Hand Injury    HPI Steven Barrett is a 52 y.o. male presenting for right index finger and right middle finger pain and swelling after a piece of equipment crushing his hand.  He reports most pain of the distal right index finger.  Has subungual hematoma of this finger.  He has been taking ibuprofen and applying ice and warm soaks to the finger.  He reports throbbing pain that kept him up last night.  He denies any numbness or weakness.  No open wounds, bleeding or drainage.  He is right-handed.  No other complaints or concerns.  HPI  Past Medical History:  Diagnosis Date   Anxiety    Hypertension    Sleep apnea    Stye external    LEFT EYE/  RESOLVING    Patient Active Problem List   Diagnosis Date Noted   Incisional hernia, without obstruction or gangrene    Appendix disease    Sleep apnea 02/22/2018   Hypertension 01/20/2018   Overweight (BMI 25.0-29.9) 09/25/2014   FH: diabetes mellitus 09/25/2014   FH: colon cancer 09/25/2014    Past Surgical History:  Procedure Laterality Date   INCISIONAL HERNIA REPAIR N/A 02/26/2019   Procedure: INCISIONAL UMBILICAL HERNIA REPAIR WITH MESH;  Surgeon: Henrene Dodge, MD;  Location: [facility];  Service: General;  Laterality: N/A;   KNEE SURGERY Right    LAPAROSCOPIC APPENDECTOMY N/A 04/05/2018   Procedure: APPENDECTOMY LAPAROSCOPIC;  Surgeon: Henrene Dodge, MD;  Location: [facility];  Service: General;  Laterality: N/A;       Home Medications    Prior to Admission medications   Medication Sig Start Date End Date Taking? Authorizing Provider  ibuprofen (ADVIL) 800 MG tablet Take 1 tablet (800 mg total) by mouth every 8 (eight) hours as needed. 01/10/23  Yes Eusebio Friendly B, [name]  sertraline (ZOLOFT) 100 MG tablet Take 1 tablet (100 mg total) by mouth daily. 07/20/19   Trey Sailors, [name]  trimethoprim-polymyxin b (POLYTRIM) ophthalmic solution Place 1 drop into the right eye every 2 (two) hours.    [provider]    Family History Family History  Problem Relation Age of Onset   Colon cancer Mother    Diabetes Father    Pancreatitis Father     Social History Social History   Tobacco Use   Smoking status: Never   Smokeless tobacco: Current    Types: Chew  Vaping Use   Vaping status: Never Used  Substance Use Topics   Alcohol use: Yes    Alcohol/week: 0.0 standard drinks of alcohol    Comment: occasionally   Drug use: No     Allergies   Guaifenesin-codeine   Review of Systems Review of Systems  Musculoskeletal:  Positive for arthralgias and joint swelling.  Skin:  Positive for color change. Negative for wound.  Neurological:  Negative for weakness and numbness.     Physical Exam Triage Vital Signs ED Triage Vitals [01/10/23 1533]  Encounter Vitals Group     BP      Systolic BP Percentile      Diastolic BP Percentile      Pulse      Resp      Temp      Temp src      SpO2  Weight      Height      Head Circumference      Peak Flow      Pain Score 6     Pain Loc      Pain Education      Exclude from Growth Chart    No data found.  Updated Vital Signs BP (!) 145/101 (BP Location: Right Arm)   Pulse 96   Temp 98.3 F (36.8 C) (Oral)   Resp 18   SpO2 98%      Physical Exam Vitals and nursing note reviewed.  Constitutional:      General: He is not in acute distress.    Appearance: Normal appearance. He is well-developed. He is not ill-appearing.  HENT:     Head: Normocephalic and atraumatic.  Eyes:     General: No scleral icterus.    Conjunctiva/sclera: Conjunctivae normal.  Cardiovascular:     Rate and Rhythm: Normal rate.     Pulses: Normal pulses.  Pulmonary:     Effort: Pulmonary effort is normal. No respiratory distress.  Musculoskeletal:     Cervical back: Neck supple.     Comments:  Right hand: There is mild subungual hematoma of the right index finger and tenderness throughout the distal phalanx and middle phalanx of the index finger.  Tenderness of the distal phalanx of the right middle finger.  Reduced range of motion of DIP joints of index finger and middle finger.  Good pulses and strength.  Skin:    General: Skin is warm and dry.     Capillary Refill: Capillary refill takes less than 2 seconds.  Neurological:     General: No focal deficit present.     Mental Status: He is alert. Mental status is at baseline.     Motor: No weakness.     Gait: Gait normal.  Psychiatric:        Mood and Affect: Mood normal.      UC Treatments / Results  Labs (all labs ordered are listed, but only abnormal results are displayed) Labs Reviewed - No data to display  EKG   Radiology DG Hand Complete Right  Result Date: 01/10/2023 CLINICAL DATA:  Hand injury EXAM: RIGHT HAND - COMPLETE 3+ VIEW COMPARISON:  None Available. FINDINGS: No acute displaced fracture or malalignment. No radiopaque foreign body in the soft tissues. Mild degenerative change at the first MCP joint IMPRESSION: No acute osseous abnormality. Electronically Signed   By: Jasmine Pang M.D.   On: 01/10/2023 19:06    Procedures Procedures (including critical care time)  Medications Ordered in UC Medications - No data to display  Initial Impression / Assessment and Plan / UC Course  I have reviewed the triage vital signs and the nursing notes.  Pertinent labs & imaging results that were available during my care of the patient were reviewed by me and considered in my medical decision making (see chart for details).   52 year old male presenting for right index finger pain and right middle finger pain after crush injury that he sustained yesterday.  X-ray of the right hand obtained today shows concern for distal phalanx fracture of the right index finger.  Discussed this with patient.  He was placed in a  finger splint.  Reviewed RICE guidelines, fracture guidelines and advised to follow-up with orthopedics.  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  Send 800 mg ibuprofen tablets.  Reviewed that I will contact him with the official results via MyChart once they return.  Information given to follow-up with Ortho.   Final Clinical Impressions(s) / UC Diagnoses   Final diagnoses:  Closed nondisplaced fracture of distal phalanx of right index finger, initial encounter     Discharge Instructions      -The x-ray shows a fracture of your distal index finger. - Wear splint and avoid use of this finger until cleared by orthopedics.  Contact one of the offices below for follow-up. - I will send you a message later on with the official radiology report and it will also be accessible for you to view. - Ice and elevate the finger. - If you notice increased redness, increased swelling, worsening pain you need to be seen again right away.  You have a condition requiring you to follow up with Orthopedics so please call one of the following office for appointment:   [facility] Address: [address], [place], [place] [address] Phone: [phone]  [facility] [address] [place], [place] [address] Phone: [phone]  [facility] [address] [place], [place] [address] Phone: [phone]      ED Prescriptions     Medication Sig Dispense Auth. Provider   ibuprofen (ADVIL) 800 MG tablet Take 1 tablet (800 mg total) by mouth every 8 (eight) hours as needed. 30 tablet Gareth Morgan      PDMP not reviewed this encounter.   Shirlee Latch, [name] 01/11/23 [phone]

## 2023-01-10 NOTE — ED Triage Notes (Signed)
Pt slammed his hand in a piece of equipment yesterday. He has right pointer and middle finger pain.

## 2023-07-20 ENCOUNTER — Ambulatory Visit
Admission: EM | Admit: 2023-07-20 | Discharge: 2023-07-20 | Disposition: A | Discharge status: Home / Self Care | Attending: Family Medicine | Admitting: Family Medicine

## 2023-07-20 ENCOUNTER — Ambulatory Visit (INDEPENDENT_AMBULATORY_CARE_PROVIDER_SITE_OTHER)

## 2023-07-20 DIAGNOSIS — M25561 Pain in right knee: Secondary | ICD-10-CM

## 2023-07-20 MED ORDER — PREDNISONE 10 MG (21) PO TBPK: ORAL_TABLET | Freq: Every day | ORAL | 0 refills | Status: AC

## 2023-07-20 NOTE — Discharge Instructions (Addendum)
 On my review of your xray images, you did not have any fractures or dislocated bones. The radiologist notes some mild arthritis in the inner part of your knee with some fluid. You should see your results in MyChart.   I am concerned you have a meniscual injury. Follow up with an orhtopedic provider. Take Tylenol  1000 mg every 8 hours as needed for pain. Take the prednisone as prescribed with food to prevent stomach irritation.  Continue wearing your knee brace.

## 2023-07-20 NOTE — ED Triage Notes (Signed)
 Pt c/o R knee pain x1 wk. States he bumped it against a horse shoe & pain started after. Has tried tylenol  w/o relief.

## 2023-07-20 NOTE — ED Provider Notes (Signed)
 [facility]    CSN: [id number] Arrival date & time: 07/20/23  1318      History   Chief Complaint Chief Complaint  Patient presents with   Knee Pain    HPI  HPI Steven Barrett is a 53 y.o. male.   Steven Barrett presents for right knee pain for the past week. He injured his inner knee after twisting injury 6 years ago.  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  He had immediate pain. Has some swelling but no bruiising.  He is not able to sleep due to the throbbing pain.  Taking 650 mg Tylenol  every other day. Goody's powder didn't help.  He is allergic to ibuprofen .  He wears a velcro brace which helps him get around.   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     Past Medical History:  Diagnosis Date   Anxiety    Hypertension    Sleep apnea    Stye external    LEFT EYE/  RESOLVING    Patient Active Problem List   Diagnosis Date Noted   Incisional hernia, without obstruction or gangrene    Appendix disease    Sleep apnea 02/22/2018   Hypertension 01/20/2018   Overweight (BMI 25.0-29.9) 09/25/2014   FH: diabetes mellitus 09/25/2014   FH: colon cancer 09/25/2014    Past Surgical History:  Procedure Laterality Date   INCISIONAL HERNIA REPAIR N/A 02/26/2019   Procedure: INCISIONAL UMBILICAL HERNIA REPAIR WITH MESH;  Surgeon: Emmalene Hare, MD;  Location: [facility];  Service: General;  Laterality: N/A;   KNEE SURGERY Right    LAPAROSCOPIC APPENDECTOMY N/A 04/05/2018   Procedure: APPENDECTOMY LAPAROSCOPIC;  Surgeon: Emmalene Hare, MD;  Location: [facility];  Service: General;  Laterality: N/A;       Home Medications    Prior to Admission medications   Medication Sig Start Date End Date Taking? Authorizing Provider  predniSONE  (STERAPRED UNI-PAK 21 TAB) 10 MG (21) TBPK tablet Take by mouth daily. Take 6 tabs by mouth daily for 1, then 5 tabs for 1 day, then 4 tabs for 1 day, then 3 tabs for 1 day, then 2 tabs for 1 day, then 1  tab for 1 day. 07/20/23  Yes Kylor Valverde, DO  sertraline  (ZOLOFT ) 100 MG tablet Take 1 tablet (100 mg total) by mouth daily. 07/20/19  Yes Gordon Latus, [name]    Family History Family History  Problem Relation Age of Onset   Colon cancer Mother    Diabetes Father    Pancreatitis Father     Social History Social History   Tobacco Use   Smoking status: Never   Smokeless tobacco: Current    Types: Chew  Vaping Use   Vaping status: Never Used  Substance Use Topics   Alcohol use: Yes    Alcohol/week: 0.0 standard drinks of alcohol    Comment: occasionally   Drug use: No     Allergies   Guaifenesin-codeine and Ibuprofen    Review of Systems Review of Systems: :negative unless otherwise stated in HPI.      Physical Exam Triage Vital Signs ED Triage Vitals  Encounter Vitals Group     BP 07/20/23 1335 (!) 159/90     Systolic BP Percentile --      Diastolic BP Percentile --      Pulse Rate 07/20/23 1335 60     Resp 07/20/23 1335 16     Temp 07/20/23 1335  98.3 F (36.8 C)     Temp Source 07/20/23 1335 Oral     SpO2 07/20/23 1335 95 %     Weight 07/20/23 1335 210 lb (95.3 kg)     Height 07/20/23 1335 5\' 11"  (1.803 m)     Head Circumference --      Peak Flow --      Pain Score 07/20/23 1338 8     Pain Loc --      Pain Education --      Exclude from Growth Chart --    No data found.  Updated Vital Signs BP (!) 159/90 (BP Location: Right Arm)   Pulse 60   Temp 98.3 F (36.8 C) (Oral)   Resp 16   Ht 5\' 11"  (1.803 m)   Wt 95.3 kg   SpO2 95%   BMI 29.29 kg/m   Visual Acuity Right Eye Distance:   Left Eye Distance:   Bilateral Distance:    Right Eye Near:   Left Eye Near:    Bilateral Near:     Physical Exam GEN: well appearing male in no acute distress  CVS: well perfused  RESP: speaking in full sentences without pause, no respiratory distress  MSK:   Right Knee Exam -Inspection: no deformity, no discoloration -Palpation: medial joint line  tenderness, no tibial tuberosity tenderness, + mild joint infusion, patellar tenderness  -ROM: Extension: 0 degrees; Flexion: 140 degrees.  Strength testing was limited due to pain -Special Tests: Varus Stress: Negative; Valgus Stress: Negative; Anterior: Negative; Posterior drawer: Negative;  Thessaly: positive; Patellar grind: Negative -Limb neurovascularly intact, no instability noted    UC Treatments / Results  Labs (all labs ordered are listed, but only abnormal results are displayed) Labs Reviewed - No data to display  EKG   Radiology DG Knee Complete 4 Views Right Result Date: 07/20/2023 CLINICAL DATA:  Right knee pain for a week. EXAM: RIGHT KNEE - COMPLETE 4 VIEW COMPARISON:  None Available. FINDINGS: No fracture or dislocation. Preserved bone mineralization. Minimal joint space loss of the medial compartment with some small osteophytes. Trace joint fluid on the lateral view. IMPRESSION: Minimal degenerative changes of the medial compartment. Trace joint fluid on lateral view. Please correlate for the etiology of the effusion. Electronically Signed   By: Adrianna Horde M.D.   On: 07/20/2023 14:30     Procedures Procedures (including critical care time)  Medications Ordered in UC Medications - No data to display  Initial Impression / Assessment and Plan / UC Course  I have reviewed the triage vital signs and the nursing notes.  Pertinent labs & imaging results that were available during my care of the patient were reviewed by me and considered in my medical decision making (see chart for details).      Pt is a 53 y.o.  male with 1 week of right knee pain with history of chronic right knee pain.  On exam, pt has tenderness at medial joint line and patella concerning for possible fracture.  He has a mild joint effusion palpable.  Obtained right knee plain films. Personally interpreted by me were unremarkable for fracture or dislocation. Radiologist report reviewed and  additionally notes trace fluid in the medial compartment, there are some small osteophytes.  No soft tissue swelling.  Patient to continue wearing his knee brace.  Patient to gradually return to normal activities, as tolerated and continue ordinary activities within the limits permitted by pain. Prescribed prednisone  for pain relief.  Tylenol  PRN.  Patient to follow up with orthopedic provider as I am concerned he has had a meniscal injury..  Return and ED precautions given. Understanding voiced. Discussed MDM, treatment plan and plan for follow-up with patient who agrees with plan.   Final Clinical Impressions(s) / UC Diagnoses   Final diagnoses:  Acute pain of right knee     Discharge Instructions      On my review of your xray images, you did not have any fractures or dislocated bones. The radiologist notes some mild arthritis in the inner part of your knee with some fluid. You should see your results in MyChart.   I am concerned you have a meniscual injury. Follow up with an orhtopedic provider. Take Tylenol  1000 mg every 8 hours as needed for pain. Take the prednisone  as prescribed with food to prevent stomach irritation.  Continue wearing your knee brace.          ED Prescriptions     Medication Sig Dispense Auth. Provider   predniSONE  (STERAPRED UNI-PAK 21 TAB) 10 MG (21) TBPK tablet Take by mouth daily. Take 6 tabs by mouth daily for 1, then 5 tabs for 1 day, then 4 tabs for 1 day, then 3 tabs for 1 day, then 2 tabs for 1 day, then 1 tab for 1 day. 21 tablet Akshita Italiano, DO      PDMP not reviewed this encounter.   Fidel Huddle, DO 07/22/23 1757

## 2023-08-01 ENCOUNTER — Ambulatory Visit: Admitting: Physician Assistant

## 2024-01-23 ENCOUNTER — Encounter: Payer: Self-pay | Admitting: Radiology
